# Patient Record
Sex: Female | Born: 1965 | Race: White | Hispanic: No | Marital: Married | Smoking: Never smoker
Health system: Southern US, Community
[De-identification: ages and names within clinical notes are randomized; demographics above are authoritative.]

## PROBLEM LIST (undated history)

## (undated) DIAGNOSIS — G43909 Migraine, unspecified, not intractable, without status migrainosus: Secondary | ICD-10-CM

## (undated) DIAGNOSIS — F419 Anxiety disorder, unspecified: Secondary | ICD-10-CM

## (undated) HISTORY — PX: ABDOMINAL HYSTERECTOMY: SHX81

---

## 1998-10-15 ENCOUNTER — Ambulatory Visit (HOSPITAL_COMMUNITY): Admission: RE | Admit: 1998-10-15 | Discharge: 1998-10-15 | Payer: Self-pay | Admitting: Internal Medicine

## 1998-10-15 ENCOUNTER — Encounter: Payer: Self-pay | Admitting: Internal Medicine

## 1999-06-21 ENCOUNTER — Ambulatory Visit (HOSPITAL_COMMUNITY): Admission: RE | Admit: 1999-06-21 | Discharge: 1999-06-21 | Payer: Self-pay | Admitting: *Deleted

## 2000-07-28 ENCOUNTER — Encounter: Payer: Self-pay | Admitting: Family Medicine

## 2000-07-28 ENCOUNTER — Ambulatory Visit (HOSPITAL_COMMUNITY): Admission: RE | Admit: 2000-07-28 | Discharge: 2000-07-28 | Payer: Self-pay | Admitting: Family Medicine

## 2000-07-31 ENCOUNTER — Encounter: Payer: Self-pay | Admitting: Urology

## 2000-07-31 ENCOUNTER — Emergency Department (HOSPITAL_COMMUNITY): Admission: EM | Admit: 2000-07-31 | Discharge: 2000-07-31 | Payer: Self-pay | Admitting: Emergency Medicine

## 2000-08-02 ENCOUNTER — Other Ambulatory Visit: Admission: RE | Admit: 2000-08-02 | Discharge: 2000-08-02 | Payer: Self-pay | Admitting: Family Medicine

## 2000-08-04 ENCOUNTER — Inpatient Hospital Stay (HOSPITAL_COMMUNITY): Admission: AD | Admit: 2000-08-04 | Discharge: 2000-08-07 | Payer: Self-pay | Admitting: Obstetrics and Gynecology

## 2000-08-05 ENCOUNTER — Encounter: Payer: Self-pay | Admitting: Obstetrics and Gynecology

## 2001-02-06 ENCOUNTER — Other Ambulatory Visit: Admission: RE | Admit: 2001-02-06 | Discharge: 2001-02-06 | Payer: Self-pay | Admitting: Obstetrics and Gynecology

## 2001-02-06 ENCOUNTER — Encounter (INDEPENDENT_AMBULATORY_CARE_PROVIDER_SITE_OTHER): Payer: Self-pay | Admitting: Specialist

## 2001-02-07 ENCOUNTER — Encounter (INDEPENDENT_AMBULATORY_CARE_PROVIDER_SITE_OTHER): Payer: Self-pay

## 2001-02-07 ENCOUNTER — Observation Stay (HOSPITAL_COMMUNITY): Admission: RE | Admit: 2001-02-07 | Discharge: 2001-02-08 | Payer: Self-pay | Admitting: Obstetrics and Gynecology

## 2004-01-06 ENCOUNTER — Other Ambulatory Visit: Admission: RE | Admit: 2004-01-06 | Discharge: 2004-01-06 | Payer: Self-pay | Admitting: Family Medicine

## 2005-12-28 ENCOUNTER — Ambulatory Visit (HOSPITAL_COMMUNITY): Admission: RE | Admit: 2005-12-28 | Discharge: 2005-12-28 | Payer: Self-pay | Admitting: Family Medicine

## 2007-01-30 ENCOUNTER — Ambulatory Visit (HOSPITAL_COMMUNITY): Admission: RE | Admit: 2007-01-30 | Discharge: 2007-01-30 | Payer: Self-pay | Admitting: Family Medicine

## 2007-03-06 ENCOUNTER — Other Ambulatory Visit: Admission: RE | Admit: 2007-03-06 | Discharge: 2007-03-06 | Payer: Self-pay | Admitting: Family Medicine

## 2008-04-22 ENCOUNTER — Ambulatory Visit (HOSPITAL_COMMUNITY): Admission: RE | Admit: 2008-04-22 | Discharge: 2008-04-22 | Payer: Self-pay | Admitting: Family Medicine

## 2009-09-22 ENCOUNTER — Ambulatory Visit (HOSPITAL_COMMUNITY): Admission: RE | Admit: 2009-09-22 | Discharge: 2009-09-22 | Payer: Self-pay | Admitting: Family Medicine

## 2011-04-22 NOTE — H&P (Signed)
[facility]  Patient:    Michelle Cameron, Michelle Cameron                     MRN: [id number] Adm. Date:  [id number] Disc. Date: [id number] Attending:  Shelba Flake CC:         Dario Guardian, M.D.  Lindaann Slough, M.D.   History and Physical  HISTORY OF PRESENT ILLNESS:   The patient is a 45 year old female para 2-0-1-2 who is admitted for IV hydration and pain medication because of kidney stones. The patient had a CT scan performed on July 28, 2000 that showed a 6 mm right kidney stone.  The patient has a history of kidney stones.  The appendix appeared normal.  There was no evidence of an obstructive process in the right ureter.  Review of the pelvis showed what appeared to be normal pelvic organs. The patient has not been hungry, but denies diarrhea and bloody stools.  She denies fever and chills.  She had been seen by a urologist and by her family physician and they were uncertain of the etiology of her discomfort.  Her last menstrual period was July 17, 2000.  Her husband had a vasectomy for contraception.  PAST MEDICAL HISTORY:         The patient fractured her right wrist as a child.  The patient had a cesarean section in 1995 and again in 1998.  She had a D&C in 1994.  She had her wisdom teeth removed in 1996.  ALLERGIES:                    PENICILLIN.  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 She denies cigarette use, alcohol use, and recreational drug use.  REVIEW OF SYSTEMS:            See history of present illness.  FAMILY HISTORY:               The patients mother has thyroid problems.  PHYSICAL EXAMINATION:  VITAL SIGNS:                  Vital signs are stable.  HEENT:                        Within normal limits except for patient in mild distress.  CHEST:                        Clear.  HEART:                        Regular rate and rhythm.  BREASTS:                      No  masses.  ABDOMEN:                      Soft in the upper quadrant and there is slight guarding in the lower quadrant.  There is no rebound tenderness.  Bowel sounds are normal.  No masses are appreciated.  BACK:                         No CVA tenderness.  EXTREMITIES:  Within normal limits.  NEUROLOGIC:                   Normal.  PELVIC:                       External genitalia is normal.  Vagina is normal. Cervix is nontender to motion.  Uterus is upper limits of normal size and anterior.  Adnexa:  No masses are appreciated.  There is mild tenderness in the right adnexa.  ASSESSMENT:                   1. Right lower abdominal pain.                               2. A 6 mm kidney stone.  PLAN:                         A long discussion was held with the patient and her husband about possible etiologies of her discomfort.  We plan to bring the patient to the hospital for IV hydration and pain medication.  We will reevaluate on August 05, 2000 and then plan future therapies based on how she is feeling at that time. DD:  08/04/00 TD:  08/04/00 Job: [id number]

## 2011-04-22 NOTE — Op Note (Signed)
[facility]  Patient:    PRINCELLA, JASKIEWICZ                     MRN: [id number] Proc. Date: 02/07/01 Adm. Date:  [id number] Attending:  Leonard Schwartz                           Operative Report  PREOPERATIVE DIAGNOSIS:       Pelvic pain.  Pelvic adhesions.  POSTOPERATIVE DIAGNOSIS:      Pelvic pain.  Endometriosis.  OPERATION:                    Diagnostic laparoscopy with vaginal hysterectomy.  SURGEON:                      Janine Limbo, M.D.  ASSISTANT:                    Henreitta Leber, P.A.  ANESTHESIA:                   General anesthesia.  ESTIMATED BLOOD LOSS:  INDICATIONS:                  Ms. Bains is a 45 year old female, para 2-0-1-2, who presents with pelvic pain.  Pain medications and hormonal therapy have not relieved her discomfort.  The patient had a diagnostic laparoscopy in 2001 which showed a thick band of adhesions between the anterior uterus and the bladder flap.  The patient has had two prior cesarean deliveries.  The adhesions were lysed at the time of laparoscopy.  The patients discomfort did not improve, however.  She now wants to proceed with definitive therapy.  She understands the indications for her procedure and she accepts the risks of, but not limited to, anesthetic complications, bleeding, infections, and possible damage to the surrounding organs.  FINDINGS:                     The uterus was upper limits of normal size. There was endometriosis present on the anterior surface of the uterus.  The fallopian tubes and ovaries appeared normal.  There were no adhesions noted at this time.  The bowel and the upper abdomen appeared normal.  DESCRIPTION OF PROCEDURE:     The patient was taken to the operating room where a general anesthesia was given.  The patients abdomen, perineum, and vagina were prepped with multiple layers of Betadine.  A three-way Foley catheter was placed in the bladder.   Examination under anesthesia was performed.  A Hulka tenaculum was placed inside the uterus.  The patient was then sterilely draped.  The subumbilical area was injected with 5 cc of 0.5% Marcaine.  A subumbilical incision was made and the Veress needle was inserted without difficulty.  Proper placement was confirmed using the saline drop test. A pneumoperitoneum was obtained.  The laparoscopic trocar and then the laparoscope were substituted for the Veress needle.  The pelvic structures were visualized with findings as mentioned above.  We were then comfortable that we could safely proceed with a vaginal hysterectomy.  The cervix was then injected with a diluted solution of Pitressin and saline.  A circumferential incision was made around the cervix and then the anterior and posterior cul-de-sacs were sharply entered.  Alternating from left to right, uterosacral ligaments, paracervical tissues, parametrial tissues, uterine arteries, and upper pedicles  were clamped, cut, sutured, and tied securely.  The uterus was inverted through the posterior colpotomy.  The remainder of the upper pedicles were then secured and the uterus was transected from our operative field.  The upper pedicles were ligated using first a free tie and then a tied suture ligature.  Hemostasis was achieved using figure-of-eight sutures in the cuff. At this point hemostasis was noted to be adequate.  Sutures attached to the uterosacral ligaments were brought out through the vaginal angles and tied securely.  A McCall culdoplasty was placed in the posterior cul-de-sac incorporating the uterosacral ligaments bilaterally and the posterior peritoneal surface.  After a final check was made for hemostasis again, then the vaginal cuff was closed using figure-of-eight sutures.  We incorporated the anterior vaginal mucosa, the anterior peritoneum, the posterior peritoneum, and then the posterior vaginal mucosa.  Sponge, needle,  and instrument counts were correct.  0 Vicryl was the suture material used throughout the case.  The McCall culdoplasty suture was then tied and the apex of the vagina was noted to elevate into the midpelvis.  We then reestablished a pneumoperitoneum.  The pelvis was carefully inspected and there was no evidence of damage to any of the vital pelvic structures.  Hemostasis was adequate.  The pneumoperitoneum was then allowed to escape.  All instruments were removed.  The subumbilical incision was closed using a deep and superficial suture of 3-0 Vicryl.  Sponge, needle, and instrument counts were correct x 2 occasions.  The estimated blood loss was 200 cc.  The patient was noted to drain clear yellow urine.  The patient tolerated her procedure well. She was given 30 mg of IV Toradol during her procedure.  She was awakened from her anesthetic and taken to the recovery room in stable condition. DD:  02/07/01 TD:  02/08/01 Job: [id number]

## 2011-04-22 NOTE — Discharge Summary (Signed)
[facility]  Patient:    Michelle Cameron, Michelle Cameron                     MRN: [id number] Adm. Date:  [id number] Disc. Date: [id number] Attending:  Leonard Schwartz Dictator:   Marquis Lunch. Lowell Guitar, PA-C                           Discharge Summary  DISCHARGE DIAGNOSES:          Pelvic pain, pelvic adhesions, endometriosis.  PROCEDURE:                    On the date of admission patient underwent a diagnostic laparoscopy and a vaginal hysterectomy.  HISTORY OF PRESENT ILLNESS:   Ms. Hoffert is a 45 year old female para 2-0-1-2 with a history of pelvic pain which has been unresponsive to hormonal therapies and prescription analgesia.  Patient presents for vaginal hysterectomy with laparoscopic assistance due to her history of pelvic adhesions.  Please see dictated history and physical examination for details.  PHYSICAL EXAMINATION  VITAL SIGNS:                  Weight 217 pounds, height 5 feet 6.25 inches.  GENERAL:                      Within normal limits.  PELVIC:                       EG, BUS is within normal limits.  Vagina is normal.  Cervix is nontender.  Uterus is upper limits of normal size, shape, and consistency.  Adnexa without masses.  Rectovaginal confirms.  HOSPITAL COURSE:              On the date of admission patient underwent a vaginal hysterectomy with assistance through the laparoscope, tolerating both procedures well.  Postoperative course was unremarkable with patient quickly tolerating a regular diet and resuming bladder function by postoperative day #1 and was deemed ready for discharge home.  Postoperative hemoglobin 10.4, preoperative hemoglobin 12.9.  DISCHARGE MEDICATIONS:        1. Vicodin one to two tablets q.4-6h. as needed                                  for pain.                               2. Phenergan 25 mg one tablet q.i.d. as needed                                  for nausea.                               3.  Ibuprofen 600 mg one tablet q.6h. with food                                  for five days, then as needed for pain.  4. Stool softeners 60 mg one tablet b.i.d. until                                  bowel movements are regular.                               5. Iron 325 mg one tablet b.i.d. for six weeks.  FOLLOW-UP:                    Patient has a six week postoperative appointment with Dr. Stefano Gaul on March 26, 2001 at 2:30 p.m.  DISCHARGE INSTRUCTIONS:       Patient was given a copy of [facility] postoperative instruction sheet.  She was further advised to avoid driving for two weeks, heavy lifting for four weeks, and intercourse for six weeks.  PATHOLOGY:                    Uterus and cervix:  Cervix with no pathologic abnormalities, benign secretory endometrium, 4 mm atypical uterine leiomyoma, uterine serosal fibrous adhesions associated with extension foreign body reaction. DD:  02/28/01 TD:  02/28/01 Job: [id number]

## 2011-04-22 NOTE — Op Note (Signed)
[facility]  Patient:    Michelle Cameron, Michelle Cameron                     MRN: [id number] Proc. Date: 08/06/00 Adm. Date:  [id number] Disc. Date: [id number] Attending:  Leonard Schwartz CC:         Lindaann Slough, M.D.  Dario Guardian, M.D.   Operative Report  PREOPERATIVE DIAGNOSES:       1. Pelvic pain.                               2. Right kidney stone.                               3. Right ovarian cyst.  POSTOPERATIVE DIAGNOSES:      1. Pelvic pain.                               2. Right kidney stone.                               3. right ovarian cyst.                               4. Pelvic adhesions.  OPERATION/PROCEDURE:          1. Diagnostic laparoscopy.                               2. Laparoscopic lysis of adhesions.                               3. Laparoscopic aspiration of right ovarian                                  cyst.  SURGEON:                      Janine Limbo, M.D.  ANESTHESIA:                   General.  INDICATIONS FOR PROCEDURE:    Ms. Keener is a 45 year old who presents complaining of three weeks of abdominal pain.  Her pain got acutely worse one week ago.  A CT scan and an ultrasound of the pelvis showed a right kidney stone.  A right ovarian cyst was also noted.  The patient was hospitalized on August 04, 2000 for vigorous hydration and for IV pain medications.  Her discomfort did not resolve.  The patient presents at this point for diagnostic laparoscopy.  The patient has had three cesarean sections in the past.  The patient understands the indications for her surgical procedure, and she accepts the risks of, but not limited to, anesthetic complications, bleeding, infection, and possible damage to the surrounding organs.  I consulted a urologist today and it was felt that urology input was not necessary.  FINDINGS:                     The right fallopian tube, left  fallopian tube, and left ovary were  normal.  The right ovary was normal and it continued a functional cyst.  There were adhesions between the omentum and the anterior abdominal wall in the pelvis.  There was also a thick band of adhesions between the anterior uterus and the anterior abdominal wall.  DESCRIPTION OF PROCEDURE:     The patient was taken to the operating room and a general anesthetic given.  The abdomen, perineum, and vagina were prepped with multiple layers of Betadine.  A Hulka tenaculum was placed inside the uterus and examined under anesthesia was performed.  A Foley catheter was placed in the bladder.  The patient was sterilely draped.  The subumbilical area was injected with Marcaine 0.25% and an incision made.  The Veress needle was inserted and proper placement confirmed using the saline drop test.  A pneumoperitoneum was then obtained.  The laparoscopic trocar and laparoscope were substituted for the Veress needle and the pelvis visualized, with findings as mentioned above.  The suprapubic area on the left was then injected with Marcaine 0.25% and a 5 mm trocar inserted into the abdominal cavity under direct visualization.  Pictures were taken of the patients pelvic anatomy.  The adhesions were then lysed using a combination of sharp dissection, blunt dissection, and hydrodissection.  Care was taken not to damage any of the vital underlying structures.  Hemostasis was achieved of the anterior uterus using bipolar cautery.  The cyst in the right ovary was then aspirated.  The pelvis was vigorously irrigated.  Hemostasis was adequate. All instruments were then removed after the pneumoperitoneum was allowed to escape.  The incisions were closed using deep and superficial sutures of 4-0 Vicryl.  Sponge, needle, and instrument counts were correct on two occasions. Estimated blood loss was 50 cc.  The patient tolerated the procedure well and was awakened from her anesthetic and taken to the recovery room in  stable condition.  She was noted to drain clear, yellow urine at the end of her procedure.  FOLLOW-UP INSTRUCTIONS:       The patient will be observed in the recovery room and then sent back to the Floor this afternoon.  We are hoping to discharge the patient home this afternoon. DD:  08/06/00 TD:  08/08/00 Job: [id number]

## 2011-04-22 NOTE — H&P (Signed)
[facility]  Patient:    Michelle Cameron, Michelle Cameron                       MRN: [id number] Attending:  Janine Limbo, M.D. CC:         Dario Guardian, M.D.   History and Physical  HISTORY OF PRESENT ILLNESS:   Michelle Cameron is a 45 year old female, para 2-0-1-2, who is admitted for vaginal hysterectomy with the assistance of the laparoscope because of continued pelvic pain. The patient has tried pain medications without success. The patient had a diagnostic laparoscopy performed on August 04, 2000, where she was found to have a broad band of adhesions in the pelvis, as well as a broad band of adhesions between the anterior uterus and the bladder. The patient has continued to have pain since that time. She has a known history of kidney stones; and even though her kidney stones have been appropriately treated and removed, her discomfort continues. She is ready to proceed at this time with definitive therapy. Her husband has had a vasectomy. The patient had a cesarean section in June of 1998. She also had a cesarean section in 1995 because of a face presentation. She had a D&C in 1994 because of a miscarriage.  DRUG ALLERGIES:               PENICILLIN causes a rash.  PAST MEDICAL HISTORY:         Please see history of present illness. The patient has a history of galacturia. This has resolved spontaneously. The patient fractured her wrist on two occasions as a child. She had her wisdom teeth removed in 1996.  SOCIAL HISTORY:               The patient drinks alcohol socially. She denies cigarette use and recreational drug use.  REVIEW OF SYSTEMS:            Noncontributory.  FAMILY HISTORY:               The patients uncle had a stroke.  PHYSICAL EXAMINATION:  VITAL SIGNS:                  Weight is 217 pounds, height is 5 feet 6 1/4 inches.  HEENT:                        Within normal limits.  CHEST:                        Clear.  HEART:                         Regular rate and rhythm.  BREASTS:                      Without masses.  ABDOMEN:                      Nontender.  EXTREMITIES:                  Within normal limits.  NEUROLOGICAL:                 Within normal limits.  PELVIC:                       External genitalia is normal. The vagina  is normal. Cervix is nontender. The uterus is upper limit normal size, shape, and consistency. Adnexa no masses. Rectovaginal exam confirms.  ASSESSMENT:                   1. Pelvic pain.                               2. Pelvic adhesions.  PLAN:                         The patient will undergo a laparoscopy-assisted vaginal hysterectomy. She understands the indications for her surgical procedure, and she accepts the risks of, but not limited to, anesthetic complications, bleeding, infections, and possible damage to the surrounding organs. She understands that no guarantees can be given concerning the total relief of her discomfort. DD:  02/06/01 TD:  02/06/01 Job: [id number]

## 2011-04-22 NOTE — Discharge Summary (Signed)
[facility]  Patient:    Michelle Cameron, Michelle Cameron                     MRN: [id number] Adm. Date:  [id number] Disc. Date: [id number] Attending:  Leonard Schwartz                           Discharge Summary  ADMITTING DIAGNOSES:          1. Abdominal/pelvic pain.                               2. Right ureteral calculus.  DISCHARGE DIAGNOSES:          1. Abdominal/pelvic pain, improved.                               2. Right ureteral calculus.                               3. Pelvic adhesions, status post laparoscopy                                  with lysis of adhesions.  DISCHARGE INSTRUCTIONS:       Printed instructions from [facility] for laparoscopy were given.  DISCHARGE MEDICATIONS:        1. Percocet 1-2 p.o. q.4h p.r.n. pain.  FOLLOW-UP:                    The patient is to call for a two week follow-up appointment.  HOSPITAL COURSE:              The patient was admitted to the hospital initially for evaluation and IV hydration.  Her pain seemed to improve slightly over the night from August 04, 2000 to August 05, 2000.  By August 06, 2000, however, the patient had had exacerbation of her pain and was given the option of proceeding with diagnostic laparoscopy for further evaluation of a gynecologic etiology of her pain.  She underwent diagnostic laparoscopy on August 06, 2000 with findings of a right ovarian cyst and pelvic adhesions.  She underwent aspiration of the right ovarian cyst and laparoscopic lysis of adhesions.  By the following morning, August 07, 2000, she was feeling much, much better, and was able to tolerate a regular diet. She was afebrile, with good GU function, and was deemed to have received maximum hospital benefit and to be ready for discharge home, with the above noted instructions. DD:  08/07/00 TD:  08/08/00 Job: [id number]

## 2011-06-27 ENCOUNTER — Other Ambulatory Visit (HOSPITAL_COMMUNITY)
Admission: RE | Admit: 2011-06-27 | Discharge: 2011-06-27 | Disposition: A | Discharge status: Home / Self Care | Payer: 59 | Source: Ambulatory Visit | Attending: Family Medicine | Admitting: Family Medicine

## 2011-06-27 DIAGNOSIS — Z0000 Encounter for general adult medical examination without abnormal findings: Secondary | ICD-10-CM | POA: Insufficient documentation

## 2013-06-24 ENCOUNTER — Other Ambulatory Visit (HOSPITAL_COMMUNITY): Payer: Self-pay | Admitting: Family Medicine

## 2013-06-24 DIAGNOSIS — Z1231 Encounter for screening mammogram for malignant neoplasm of breast: Secondary | ICD-10-CM

## 2013-07-04 ENCOUNTER — Ambulatory Visit (HOSPITAL_COMMUNITY)
Admission: RE | Admit: 2013-07-04 | Discharge: 2013-07-04 | Disposition: A | Discharge status: Home / Self Care | Payer: 59 | Source: Ambulatory Visit | Attending: Family Medicine | Admitting: Family Medicine

## 2013-07-04 DIAGNOSIS — Z1231 Encounter for screening mammogram for malignant neoplasm of breast: Secondary | ICD-10-CM | POA: Insufficient documentation

## 2015-03-24 ENCOUNTER — Other Ambulatory Visit (HOSPITAL_COMMUNITY): Payer: Self-pay | Admitting: Family Medicine

## 2015-03-24 DIAGNOSIS — Z1231 Encounter for screening mammogram for malignant neoplasm of breast: Secondary | ICD-10-CM

## 2015-04-01 ENCOUNTER — Ambulatory Visit (HOSPITAL_COMMUNITY)
Admission: RE | Admit: 2015-04-01 | Discharge: 2015-04-01 | Disposition: A | Discharge status: Home / Self Care | Payer: 59 | Source: Ambulatory Visit | Attending: Family Medicine | Admitting: Family Medicine

## 2015-04-01 DIAGNOSIS — Z1231 Encounter for screening mammogram for malignant neoplasm of breast: Secondary | ICD-10-CM | POA: Diagnosis not present

## 2015-06-10 ENCOUNTER — Other Ambulatory Visit: Payer: Self-pay | Admitting: Gastroenterology

## 2015-06-10 DIAGNOSIS — R933 Abnormal findings on diagnostic imaging of other parts of digestive tract: Secondary | ICD-10-CM

## 2015-06-12 ENCOUNTER — Other Ambulatory Visit: Payer: 59

## 2015-06-12 ENCOUNTER — Ambulatory Visit
Admission: RE | Admit: 2015-06-12 | Discharge: 2015-06-12 | Disposition: A | Discharge status: Home / Self Care | Payer: 59 | Source: Ambulatory Visit | Attending: Gastroenterology | Admitting: Gastroenterology

## 2015-06-12 DIAGNOSIS — R933 Abnormal findings on diagnostic imaging of other parts of digestive tract: Secondary | ICD-10-CM

## 2015-06-12 MED ORDER — IOPAMIDOL (ISOVUE-300) INJECTION 61%
125.0000 mL | Freq: Once | INTRAVENOUS | Status: AC | PRN
  Administered 2015-06-12: 125 mL via INTRAVENOUS

## 2015-07-20 ENCOUNTER — Ambulatory Visit: Payer: 59 | Admitting: Dietician

## 2015-11-12 ENCOUNTER — Encounter (HOSPITAL_BASED_OUTPATIENT_CLINIC_OR_DEPARTMENT_OTHER): Payer: Self-pay

## 2015-11-12 ENCOUNTER — Emergency Department (HOSPITAL_BASED_OUTPATIENT_CLINIC_OR_DEPARTMENT_OTHER)
Admission: EM | Admit: 2015-11-12 | Discharge: 2015-11-12 | Disposition: A | Discharge status: Home / Self Care | Payer: 59 | Attending: Emergency Medicine | Admitting: Emergency Medicine

## 2015-11-12 DIAGNOSIS — Z79899 Other long term (current) drug therapy: Secondary | ICD-10-CM | POA: Insufficient documentation

## 2015-11-12 DIAGNOSIS — G43909 Migraine, unspecified, not intractable, without status migrainosus: Secondary | ICD-10-CM

## 2015-11-12 DIAGNOSIS — G43009 Migraine without aura, not intractable, without status migrainosus: Secondary | ICD-10-CM | POA: Insufficient documentation

## 2015-11-12 DIAGNOSIS — Z880 Allergy status to penicillin: Secondary | ICD-10-CM | POA: Insufficient documentation

## 2015-11-12 DIAGNOSIS — H53149 Visual discomfort, unspecified: Secondary | ICD-10-CM | POA: Diagnosis present

## 2015-11-12 HISTORY — DX: Migraine, unspecified, not intractable, without status migrainosus: G43.909

## 2015-11-12 MED ORDER — KETOROLAC TROMETHAMINE 30 MG/ML IJ SOLN
30.0000 mg | Freq: Once | INTRAMUSCULAR | Status: AC
  Administered 2015-11-12: 30 mg via INTRAVENOUS
  Filled 2015-11-12: qty 1

## 2015-11-12 MED ORDER — DIPHENHYDRAMINE HCL 50 MG/ML IJ SOLN
12.5000 mg | Freq: Once | INTRAMUSCULAR | Status: AC
  Administered 2015-11-12: 12.5 mg via INTRAVENOUS
  Filled 2015-11-12: qty 1

## 2015-11-12 MED ORDER — METOCLOPRAMIDE HCL 5 MG/ML IJ SOLN
10.0000 mg | Freq: Once | INTRAMUSCULAR | Status: AC
  Administered 2015-11-12: 10 mg via INTRAVENOUS
  Filled 2015-11-12: qty 2

## 2015-11-12 MED ORDER — DEXAMETHASONE SODIUM PHOSPHATE 10 MG/ML IJ SOLN
10.0000 mg | Freq: Once | INTRAMUSCULAR | Status: AC
  Administered 2015-11-12: 10 mg via INTRAVENOUS
  Filled 2015-11-12: qty 1

## 2015-11-12 NOTE — Discharge Instructions (Signed)
Migraine Headache A migraine headache is an intense, throbbing pain on one or both sides of your head. A migraine can last for 30 minutes to several hours. CAUSES  The exact cause of a migraine headache is not always known. However, a migraine may be caused when nerves in the brain become irritated and release chemicals that cause inflammation. This causes pain. Certain things may also trigger migraines, such as:  Alcohol.  Smoking.  Stress.  Menstruation.  Aged cheeses.  Foods or drinks that contain nitrates, glutamate, aspartame, or tyramine.  Lack of sleep.  Chocolate.  Caffeine.  Hunger.  Physical exertion.  Fatigue.  Medicines used to treat chest pain (nitroglycerine), birth control pills, estrogen, and some blood pressure medicines. SIGNS AND SYMPTOMS  Pain on one or both sides of your head.  Pulsating or throbbing pain.  Severe pain that prevents daily activities.  Pain that is aggravated by any physical activity.  Nausea, vomiting, or both.  Dizziness.  Pain with exposure to bright lights, loud noises, or activity.  General sensitivity to bright lights, loud noises, or smells. Before you get a migraine, you may get warning signs that a migraine is coming (aura). An aura may include:  Seeing flashing lights.  Seeing bright spots, halos, or zigzag lines.  Having tunnel vision or blurred vision.  Having feelings of numbness or tingling.  Having trouble talking.  Having muscle weakness. DIAGNOSIS  A migraine headache is often diagnosed based on:  Symptoms.  Physical exam.  A CT scan or MRI of your head. These imaging tests cannot diagnose migraines, but they can help rule out other causes of headaches. TREATMENT Medicines may be given for pain and nausea. Medicines can also be given to help prevent recurrent migraines.  HOME CARE INSTRUCTIONS  Only take over-the-counter or prescription medicines for pain or discomfort as directed by your  health care provider. The use of long-term narcotics is not recommended.  Lie down in a dark, quiet room when you have a migraine.  Keep a journal to find out what may trigger your migraine headaches. For example, write down:  What you eat and drink.  How much sleep you get.  Any change to your diet or medicines.  Limit alcohol consumption.  Quit smoking if you smoke.  Get 7-9 hours of sleep, or as recommended by your health care provider.  Limit stress.  Keep lights dim if bright lights bother you and make your migraines worse. SEEK IMMEDIATE MEDICAL CARE IF:   Your migraine becomes severe.  You have a fever.  You have a stiff neck.  You have vision loss.  You have muscular weakness or loss of muscle control.  You start losing your balance or have trouble walking.  You feel faint or pass out.  You have severe symptoms that are different from your first symptoms. MAKE SURE YOU:   Understand these instructions.  Will watch your condition.  Will get help right away if you are not doing well or get worse.   This information is not intended to replace advice given to you by your health care provider. Make sure you discuss any questions you have with your health care provider.   Follow-up with her primary care provider for reevaluation. Return to the emergency department if you transversely over symptoms, vomiting, blurry vision, numbness or tingling in any of your extremities.

## 2015-11-12 NOTE — ED Notes (Signed)
Reports migraine that started Monday night.  Reports usually goes away with naproxen adn tylenol but hasnt went to [facility] last night got toradol with no relief.  Reports like her migraines but worse.  Pt reports nausea.  Denies blurred vision.

## 2015-11-14 NOTE — ED Provider Notes (Signed)
CSN: [id number]     Arrival date & time 11/12/15  1313 History   First MD Initiated Contact with Patient 11/12/15 1345     Chief Complaint  Patient presents with  . Migraine     (Consider location/radiation/quality/duration/timing/severity/associated sxs/prior Treatment) HPI   Michelle Cameron is a 49 y.o F who presents to the ED c/o migraine. Pt has long history of the same since she was a teenager. Pt states that she began experiencing a migraine on Monday night located in the R temporal region that has persisted. Unable to control with home naproxen. Pt states that this feels like her typical migraine, but is lasting longer than usual. Pt reports associated photophobia, nausea, no vomitingDenies visual changes, paresthesias, numbness, weakness.   Past Medical History  Diagnosis Date  . Migraine    Past Surgical History  Procedure Laterality Date  . Abdominal hysterectomy    . Cesarean section     No family history on file. Social History  Substance Use Topics  . Smoking status: Never Smoker   . Smokeless tobacco: None  . Alcohol Use: No   OB History    No data available     Review of Systems  All other systems reviewed and are negative.     Allergies  Penicillins  Home Medications   Prior to Admission medications   Medication Sig Start Date End Date Taking? Authorizing Provider  buPROPion (WELLBUTRIN XL) 150 MG 24 hr tablet Take 150 mg by mouth daily.   Yes Historical Provider, MD  busPIRone (BUSPAR) 30 MG tablet Take 30 mg by mouth 2 (two) times daily.   Yes Historical Provider, MD   BP 103/61 mmHg  Pulse 72  Temp(Src) 98.1 F (36.7 C) (Oral)  Resp 18  Ht  (1.676 m)  Wt 104.327 kg  BMI 37.14 kg/m2  SpO2 96% Physical Exam  Constitutional: She is oriented to person, place, and time. She appears well-developed and well-nourished. No distress.  HENT:  Head: Normocephalic and atraumatic.  Mouth/Throat: No oropharyngeal exudate.  Eyes: Conjunctivae and  EOM are normal. Pupils are equal, round, and reactive to light. Right eye exhibits no discharge. Left eye exhibits no discharge. No scleral icterus.  Neck: Neck supple.  Cardiovascular: Normal rate, regular rhythm, normal heart sounds and intact distal pulses.  Exam reveals no gallop and no friction rub.   No murmur heard. Pulmonary/Chest: Effort normal and breath sounds normal. No respiratory distress. She has no wheezes. She has no rales. She exhibits no tenderness.  Abdominal: Soft. She exhibits no distension. There is no tenderness. There is no guarding.  Musculoskeletal: Normal range of motion. She exhibits no edema.  Lymphadenopathy:    She has no cervical adenopathy.  Neurological: She is alert and oriented to person, place, and time. No cranial nerve deficit. She exhibits normal muscle tone. Coordination normal.  Strength 5/5 throughout. No sensory deficits.  No gait abnormality. Normal finger to nose, normal heel to shin.  Skin: Skin is warm and dry. No rash noted. She is not diaphoretic. No erythema. No pallor.  Psychiatric: She has a normal mood and affect. Her behavior is normal.  Nursing note and vitals reviewed.   ED Course  Procedures (including critical care time) Labs Review Labs Reviewed - No data to display  Imaging Review No results found. I have personally reviewed and evaluated these images and lab results as part of my medical decision-making.   EKG Interpretation None      MDM  Final diagnoses:  Migraine without status migrainosus, not intractable, unspecified migraine type   Pt HA treated and improved while in ED.  Presentation is like pts typical HA and non concerning for [facility], ICH, Meningitis, or temporal arteritis. Pt is afebrile with no focal neuro deficits, nuchal rigidity, or change in vision. Pt is to follow up with PCP to discuss prophylactic medication. Pt verbalizes understanding and is agreeable with plan to dc.      Lester KinsmanSamantha Tripp [place],  PA-C 11/14/15 1549  Loren Raceravid Yelverton, MD 11/19/15 [phone]

## 2015-11-18 ENCOUNTER — Other Ambulatory Visit: Payer: Self-pay | Admitting: Neurology

## 2015-11-18 MED ORDER — MONTELUKAST SODIUM 10 MG PO TABS: 10.0000 mg | ORAL_TABLET | Freq: Every day | ORAL | Status: DC

## 2015-12-15 ENCOUNTER — Other Ambulatory Visit: Payer: Self-pay

## 2015-12-15 ENCOUNTER — Other Ambulatory Visit: Payer: Self-pay | Admitting: *Deleted

## 2015-12-15 MED ORDER — DOXYCYCLINE HYCLATE 50 MG PO CAPS: 50.0000 mg | ORAL_CAPSULE | Freq: Two times a day (BID) | ORAL | Status: AC

## 2015-12-16 ENCOUNTER — Other Ambulatory Visit: Payer: Self-pay | Admitting: *Deleted

## 2015-12-16 MED ORDER — MONTELUKAST SODIUM 10 MG PO TABS: 10.0000 mg | ORAL_TABLET | Freq: Every day | ORAL | Status: AC

## 2016-12-07 DIAGNOSIS — N958 Other specified menopausal and perimenopausal disorders: Secondary | ICD-10-CM | POA: Diagnosis not present

## 2016-12-07 DIAGNOSIS — E782 Mixed hyperlipidemia: Secondary | ICD-10-CM | POA: Diagnosis not present

## 2016-12-07 DIAGNOSIS — E538 Deficiency of other specified B group vitamins: Secondary | ICD-10-CM | POA: Diagnosis not present

## 2016-12-14 ENCOUNTER — Other Ambulatory Visit: Payer: Self-pay | Admitting: Family Medicine

## 2016-12-14 DIAGNOSIS — E782 Mixed hyperlipidemia: Secondary | ICD-10-CM | POA: Diagnosis not present

## 2016-12-14 DIAGNOSIS — E538 Deficiency of other specified B group vitamins: Secondary | ICD-10-CM | POA: Diagnosis not present

## 2016-12-14 DIAGNOSIS — Z1231 Encounter for screening mammogram for malignant neoplasm of breast: Secondary | ICD-10-CM

## 2016-12-14 DIAGNOSIS — E559 Vitamin D deficiency, unspecified: Secondary | ICD-10-CM | POA: Diagnosis not present

## 2016-12-28 ENCOUNTER — Ambulatory Visit: Payer: 59

## 2016-12-28 DIAGNOSIS — E559 Vitamin D deficiency, unspecified: Secondary | ICD-10-CM | POA: Diagnosis not present

## 2016-12-28 DIAGNOSIS — E782 Mixed hyperlipidemia: Secondary | ICD-10-CM | POA: Diagnosis not present

## 2016-12-28 DIAGNOSIS — E538 Deficiency of other specified B group vitamins: Secondary | ICD-10-CM | POA: Diagnosis not present

## 2016-12-31 DIAGNOSIS — L2089 Other atopic dermatitis: Secondary | ICD-10-CM | POA: Diagnosis not present

## 2017-01-04 DIAGNOSIS — N958 Other specified menopausal and perimenopausal disorders: Secondary | ICD-10-CM | POA: Diagnosis not present

## 2017-01-04 DIAGNOSIS — E559 Vitamin D deficiency, unspecified: Secondary | ICD-10-CM | POA: Diagnosis not present

## 2017-01-04 DIAGNOSIS — E538 Deficiency of other specified B group vitamins: Secondary | ICD-10-CM | POA: Diagnosis not present

## 2017-01-04 DIAGNOSIS — E782 Mixed hyperlipidemia: Secondary | ICD-10-CM | POA: Diagnosis not present

## 2017-01-09 ENCOUNTER — Ambulatory Visit
Admission: RE | Admit: 2017-01-09 | Discharge: 2017-01-09 | Disposition: A | Discharge status: Home / Self Care | Payer: 59 | Source: Ambulatory Visit | Attending: Family Medicine | Admitting: Family Medicine

## 2017-01-09 DIAGNOSIS — Z1231 Encounter for screening mammogram for malignant neoplasm of breast: Secondary | ICD-10-CM

## 2017-01-11 DIAGNOSIS — N951 Menopausal and female climacteric states: Secondary | ICD-10-CM | POA: Diagnosis not present

## 2017-01-11 DIAGNOSIS — E538 Deficiency of other specified B group vitamins: Secondary | ICD-10-CM | POA: Diagnosis not present

## 2017-01-11 DIAGNOSIS — E782 Mixed hyperlipidemia: Secondary | ICD-10-CM | POA: Diagnosis not present

## 2017-01-14 DIAGNOSIS — R197 Diarrhea, unspecified: Secondary | ICD-10-CM | POA: Diagnosis not present

## 2017-01-14 DIAGNOSIS — R110 Nausea: Secondary | ICD-10-CM | POA: Diagnosis not present

## 2017-01-18 DIAGNOSIS — E538 Deficiency of other specified B group vitamins: Secondary | ICD-10-CM | POA: Diagnosis not present

## 2017-01-18 DIAGNOSIS — E782 Mixed hyperlipidemia: Secondary | ICD-10-CM | POA: Diagnosis not present

## 2017-01-18 DIAGNOSIS — E559 Vitamin D deficiency, unspecified: Secondary | ICD-10-CM | POA: Diagnosis not present

## 2017-01-25 DIAGNOSIS — E559 Vitamin D deficiency, unspecified: Secondary | ICD-10-CM | POA: Diagnosis not present

## 2017-01-25 DIAGNOSIS — E782 Mixed hyperlipidemia: Secondary | ICD-10-CM | POA: Diagnosis not present

## 2017-01-25 DIAGNOSIS — E538 Deficiency of other specified B group vitamins: Secondary | ICD-10-CM | POA: Diagnosis not present

## 2017-02-01 DIAGNOSIS — E782 Mixed hyperlipidemia: Secondary | ICD-10-CM | POA: Diagnosis not present

## 2017-02-01 DIAGNOSIS — E559 Vitamin D deficiency, unspecified: Secondary | ICD-10-CM | POA: Diagnosis not present

## 2017-02-01 DIAGNOSIS — E538 Deficiency of other specified B group vitamins: Secondary | ICD-10-CM | POA: Diagnosis not present

## 2017-02-08 DIAGNOSIS — E782 Mixed hyperlipidemia: Secondary | ICD-10-CM | POA: Diagnosis not present

## 2017-02-08 DIAGNOSIS — E559 Vitamin D deficiency, unspecified: Secondary | ICD-10-CM | POA: Diagnosis not present

## 2017-02-08 DIAGNOSIS — E538 Deficiency of other specified B group vitamins: Secondary | ICD-10-CM | POA: Diagnosis not present

## 2017-02-15 DIAGNOSIS — E538 Deficiency of other specified B group vitamins: Secondary | ICD-10-CM | POA: Diagnosis not present

## 2017-02-15 DIAGNOSIS — E782 Mixed hyperlipidemia: Secondary | ICD-10-CM | POA: Diagnosis not present

## 2017-02-15 DIAGNOSIS — E559 Vitamin D deficiency, unspecified: Secondary | ICD-10-CM | POA: Diagnosis not present

## 2017-02-22 DIAGNOSIS — E782 Mixed hyperlipidemia: Secondary | ICD-10-CM | POA: Diagnosis not present

## 2017-02-22 DIAGNOSIS — E559 Vitamin D deficiency, unspecified: Secondary | ICD-10-CM | POA: Diagnosis not present

## 2017-02-22 DIAGNOSIS — E538 Deficiency of other specified B group vitamins: Secondary | ICD-10-CM | POA: Diagnosis not present

## 2017-02-27 DIAGNOSIS — L309 Dermatitis, unspecified: Secondary | ICD-10-CM | POA: Diagnosis not present

## 2017-03-01 DIAGNOSIS — E782 Mixed hyperlipidemia: Secondary | ICD-10-CM | POA: Diagnosis not present

## 2017-03-01 DIAGNOSIS — E538 Deficiency of other specified B group vitamins: Secondary | ICD-10-CM | POA: Diagnosis not present

## 2017-03-01 DIAGNOSIS — E559 Vitamin D deficiency, unspecified: Secondary | ICD-10-CM | POA: Diagnosis not present

## 2017-03-15 DIAGNOSIS — E782 Mixed hyperlipidemia: Secondary | ICD-10-CM | POA: Diagnosis not present

## 2017-03-15 DIAGNOSIS — E559 Vitamin D deficiency, unspecified: Secondary | ICD-10-CM | POA: Diagnosis not present

## 2017-03-15 DIAGNOSIS — E538 Deficiency of other specified B group vitamins: Secondary | ICD-10-CM | POA: Diagnosis not present

## 2017-03-28 DIAGNOSIS — E538 Deficiency of other specified B group vitamins: Secondary | ICD-10-CM | POA: Diagnosis not present

## 2017-03-28 DIAGNOSIS — E559 Vitamin D deficiency, unspecified: Secondary | ICD-10-CM | POA: Diagnosis not present

## 2017-03-28 DIAGNOSIS — E782 Mixed hyperlipidemia: Secondary | ICD-10-CM | POA: Diagnosis not present

## 2017-04-12 DIAGNOSIS — E782 Mixed hyperlipidemia: Secondary | ICD-10-CM | POA: Diagnosis not present

## 2017-04-12 DIAGNOSIS — E559 Vitamin D deficiency, unspecified: Secondary | ICD-10-CM | POA: Diagnosis not present

## 2017-04-12 DIAGNOSIS — N951 Menopausal and female climacteric states: Secondary | ICD-10-CM | POA: Diagnosis not present

## 2017-04-19 DIAGNOSIS — N958 Other specified menopausal and perimenopausal disorders: Secondary | ICD-10-CM | POA: Diagnosis not present

## 2017-04-19 DIAGNOSIS — E782 Mixed hyperlipidemia: Secondary | ICD-10-CM | POA: Diagnosis not present

## 2017-09-04 ENCOUNTER — Other Ambulatory Visit: Payer: Self-pay | Admitting: Family Medicine

## 2017-09-04 DIAGNOSIS — R109 Unspecified abdominal pain: Secondary | ICD-10-CM | POA: Diagnosis not present

## 2017-09-04 DIAGNOSIS — Z23 Encounter for immunization: Secondary | ICD-10-CM | POA: Diagnosis not present

## 2017-09-04 DIAGNOSIS — Z87442 Personal history of urinary calculi: Secondary | ICD-10-CM

## 2017-09-04 DIAGNOSIS — R1084 Generalized abdominal pain: Secondary | ICD-10-CM

## 2017-09-05 ENCOUNTER — Ambulatory Visit
Admission: RE | Admit: 2017-09-05 | Discharge: 2017-09-05 | Disposition: A | Discharge status: Home / Self Care | Payer: 59 | Source: Ambulatory Visit | Attending: Family Medicine | Admitting: Family Medicine

## 2017-09-05 DIAGNOSIS — Z87442 Personal history of urinary calculi: Secondary | ICD-10-CM

## 2017-09-05 DIAGNOSIS — R109 Unspecified abdominal pain: Secondary | ICD-10-CM | POA: Diagnosis not present

## 2017-09-05 DIAGNOSIS — R1084 Generalized abdominal pain: Secondary | ICD-10-CM

## 2017-09-12 ENCOUNTER — Other Ambulatory Visit: Payer: Self-pay | Admitting: Family Medicine

## 2017-09-12 DIAGNOSIS — R109 Unspecified abdominal pain: Secondary | ICD-10-CM

## 2017-09-13 ENCOUNTER — Ambulatory Visit
Admission: RE | Admit: 2017-09-13 | Discharge: 2017-09-13 | Disposition: A | Discharge status: Home / Self Care | Payer: 59 | Source: Ambulatory Visit | Attending: Family Medicine | Admitting: Family Medicine

## 2017-09-13 ENCOUNTER — Other Ambulatory Visit (HOSPITAL_COMMUNITY): Payer: Self-pay | Admitting: Physician Assistant

## 2017-09-13 DIAGNOSIS — R1031 Right lower quadrant pain: Secondary | ICD-10-CM | POA: Diagnosis not present

## 2017-09-13 DIAGNOSIS — R1011 Right upper quadrant pain: Secondary | ICD-10-CM | POA: Diagnosis not present

## 2017-09-13 DIAGNOSIS — R1013 Epigastric pain: Secondary | ICD-10-CM | POA: Diagnosis not present

## 2017-09-13 DIAGNOSIS — R109 Unspecified abdominal pain: Secondary | ICD-10-CM

## 2017-09-15 DIAGNOSIS — R1011 Right upper quadrant pain: Secondary | ICD-10-CM | POA: Diagnosis not present

## 2017-09-25 DIAGNOSIS — R933 Abnormal findings on diagnostic imaging of other parts of digestive tract: Secondary | ICD-10-CM | POA: Diagnosis not present

## 2017-09-25 DIAGNOSIS — D72829 Elevated white blood cell count, unspecified: Secondary | ICD-10-CM | POA: Diagnosis not present

## 2017-09-25 DIAGNOSIS — R109 Unspecified abdominal pain: Secondary | ICD-10-CM | POA: Diagnosis not present

## 2017-10-03 ENCOUNTER — Ambulatory Visit (HOSPITAL_COMMUNITY): Payer: 59

## 2017-10-05 DIAGNOSIS — R1031 Right lower quadrant pain: Secondary | ICD-10-CM | POA: Diagnosis not present

## 2017-10-09 DIAGNOSIS — H66002 Acute suppurative otitis media without spontaneous rupture of ear drum, left ear: Secondary | ICD-10-CM | POA: Diagnosis not present

## 2017-10-09 DIAGNOSIS — J029 Acute pharyngitis, unspecified: Secondary | ICD-10-CM | POA: Diagnosis not present

## 2017-10-11 DIAGNOSIS — R102 Pelvic and perineal pain: Secondary | ICD-10-CM | POA: Diagnosis not present

## 2017-10-11 DIAGNOSIS — N736 Female pelvic peritoneal adhesions (postinfective): Secondary | ICD-10-CM | POA: Diagnosis not present

## 2017-10-17 DIAGNOSIS — R102 Pelvic and perineal pain: Secondary | ICD-10-CM | POA: Diagnosis not present

## 2017-10-25 DIAGNOSIS — R102 Pelvic and perineal pain: Secondary | ICD-10-CM | POA: Diagnosis not present

## 2017-11-01 DIAGNOSIS — R102 Pelvic and perineal pain: Secondary | ICD-10-CM | POA: Diagnosis not present

## 2017-11-06 DIAGNOSIS — Z0000 Encounter for general adult medical examination without abnormal findings: Secondary | ICD-10-CM | POA: Diagnosis not present

## 2017-11-06 DIAGNOSIS — E7800 Pure hypercholesterolemia, unspecified: Secondary | ICD-10-CM | POA: Diagnosis not present

## 2017-11-06 DIAGNOSIS — G43909 Migraine, unspecified, not intractable, without status migrainosus: Secondary | ICD-10-CM | POA: Diagnosis not present

## 2017-11-14 DIAGNOSIS — R102 Pelvic and perineal pain: Secondary | ICD-10-CM | POA: Diagnosis not present

## 2017-11-17 DIAGNOSIS — R1030 Lower abdominal pain, unspecified: Secondary | ICD-10-CM | POA: Diagnosis not present

## 2017-11-21 DIAGNOSIS — R1030 Lower abdominal pain, unspecified: Secondary | ICD-10-CM | POA: Diagnosis not present

## 2017-11-23 DIAGNOSIS — R1030 Lower abdominal pain, unspecified: Secondary | ICD-10-CM | POA: Diagnosis not present

## 2017-11-29 DIAGNOSIS — R1030 Lower abdominal pain, unspecified: Secondary | ICD-10-CM | POA: Diagnosis not present

## 2017-12-08 DIAGNOSIS — R1030 Lower abdominal pain, unspecified: Secondary | ICD-10-CM | POA: Diagnosis not present

## 2017-12-13 DIAGNOSIS — R1031 Right lower quadrant pain: Secondary | ICD-10-CM | POA: Diagnosis not present

## 2017-12-28 DIAGNOSIS — R102 Pelvic and perineal pain: Secondary | ICD-10-CM | POA: Diagnosis not present

## 2017-12-28 DIAGNOSIS — N898 Other specified noninflammatory disorders of vagina: Secondary | ICD-10-CM | POA: Diagnosis not present

## 2017-12-28 DIAGNOSIS — Z4003 Encounter for prophylactic removal of fallopian tube(s): Secondary | ICD-10-CM | POA: Diagnosis not present

## 2017-12-28 DIAGNOSIS — K660 Peritoneal adhesions (postprocedural) (postinfection): Secondary | ICD-10-CM | POA: Diagnosis not present

## 2018-05-20 IMAGING — US US ABDOMEN LIMITED
1 series · 14 of 25 positions shown · non-contrast
Comparison: CT scan 09/05/2017

CLINICAL DATA: Right upper quadrant pain for 10 days.

EXAM:
ULTRASOUND ABDOMEN LIMITED RIGHT UPPER QUADRANT

[Series 1: us abdomen limited · 0.12mm/px · 14 of 45 slices shown]
[im 1/45]
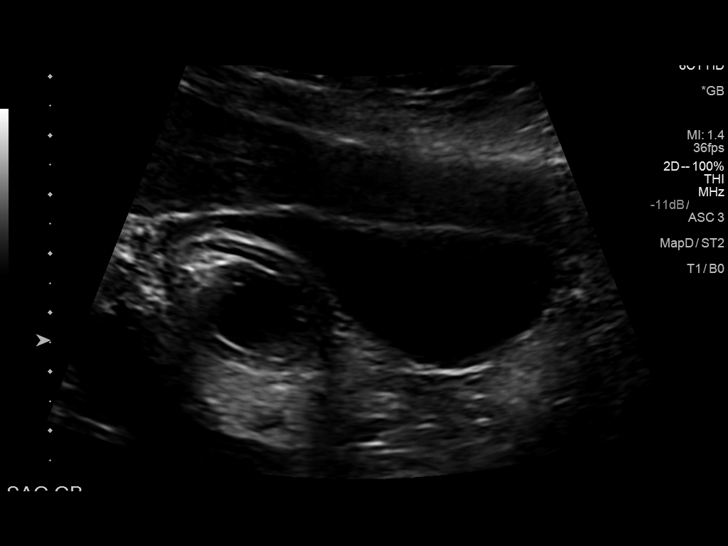
[im 4/45]
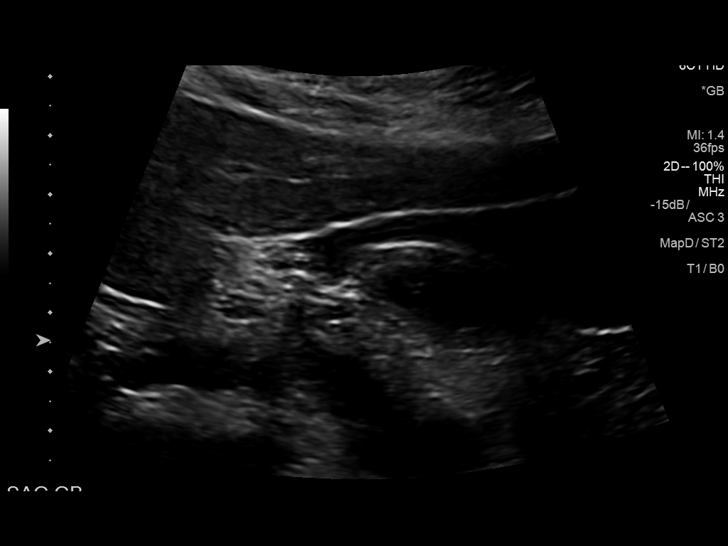
[im 8/45]
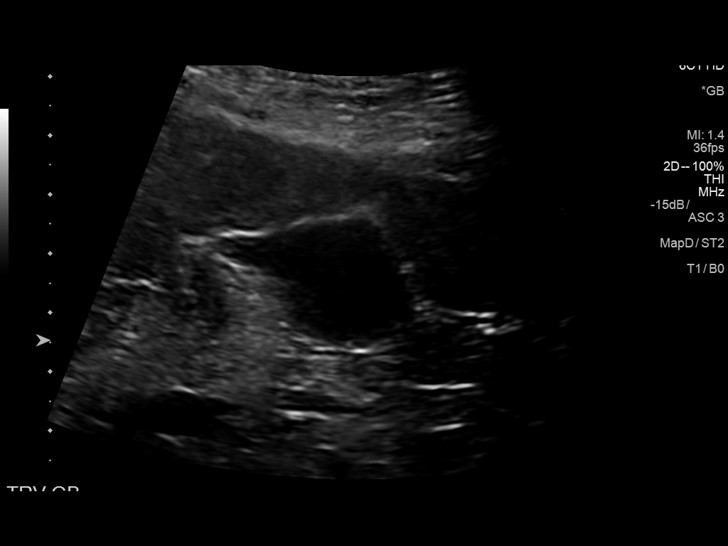
[im 12/45]
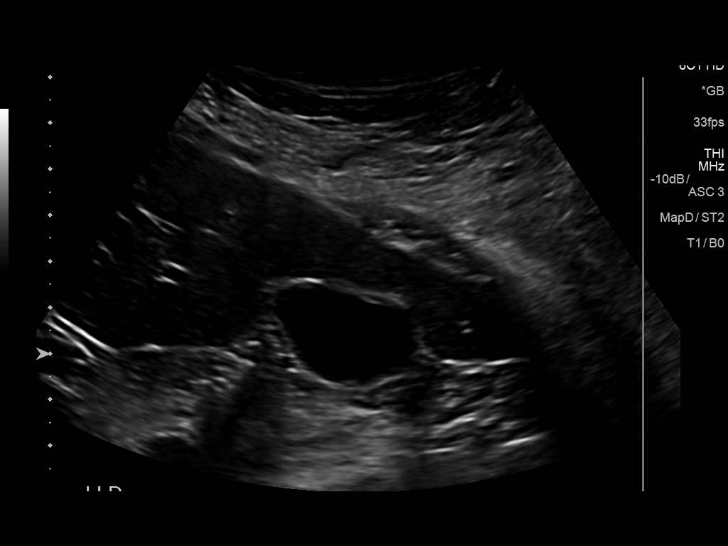
[im 15/45]
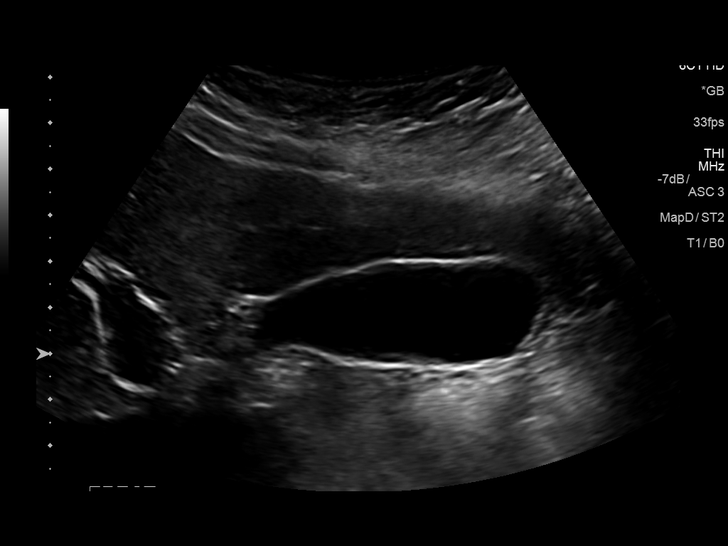
[im 17/45]
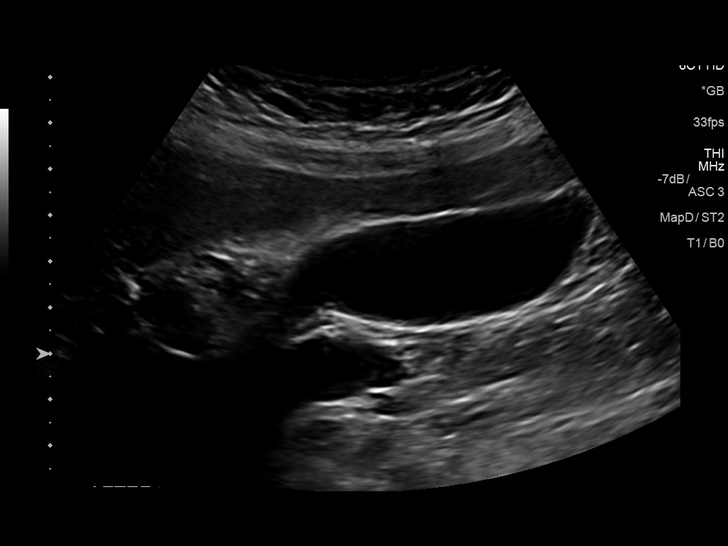
[im 21/45]
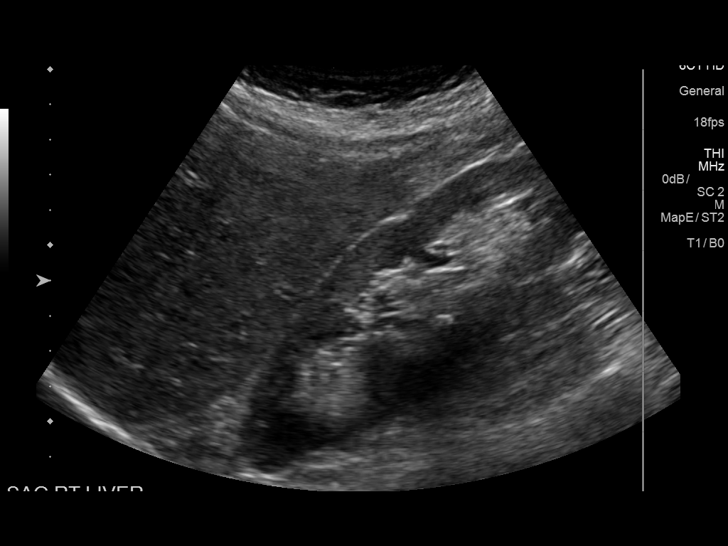
[im 24/45]
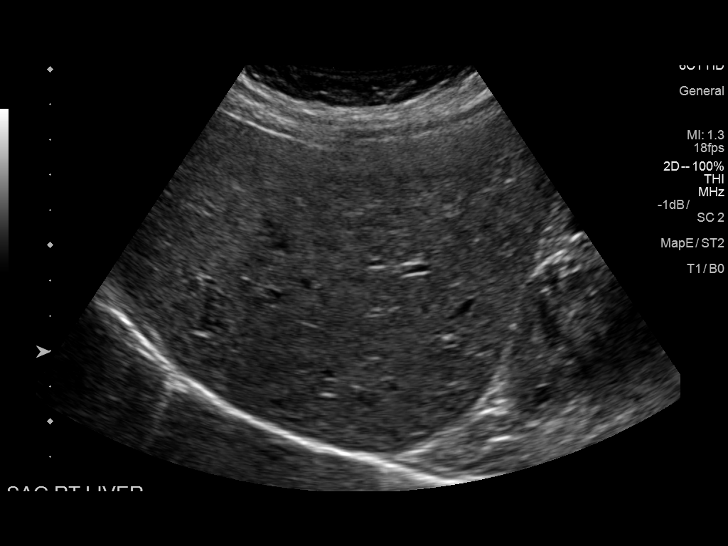
[im 28/45]
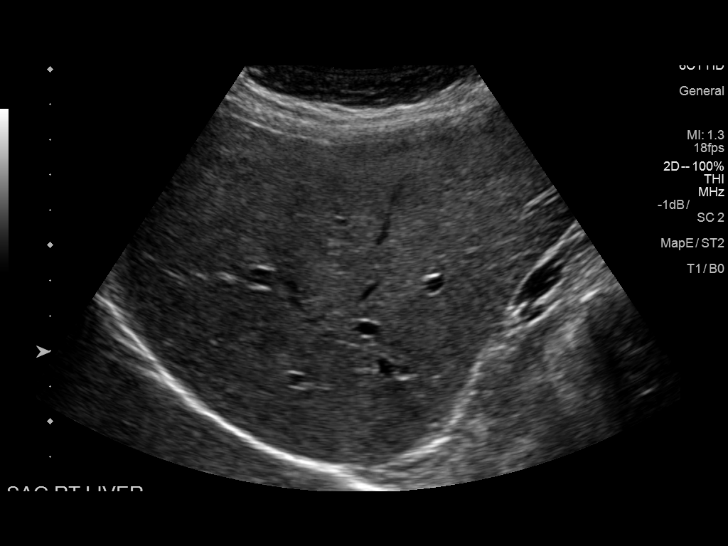
[im 30/45]
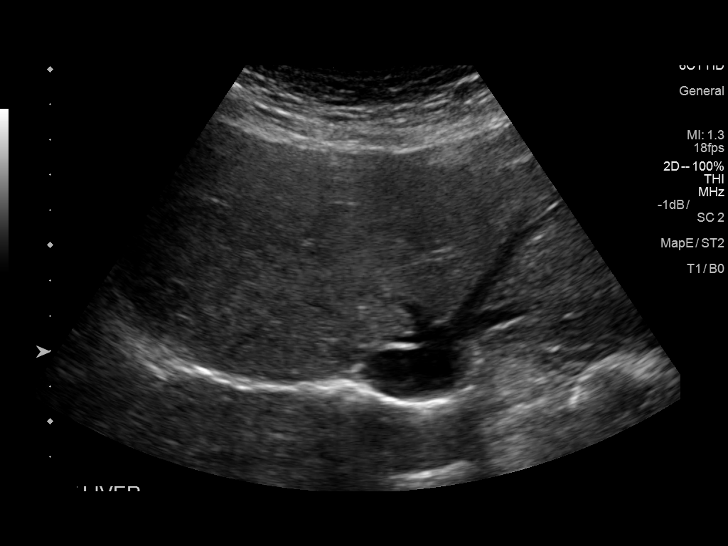
[im 34/45]
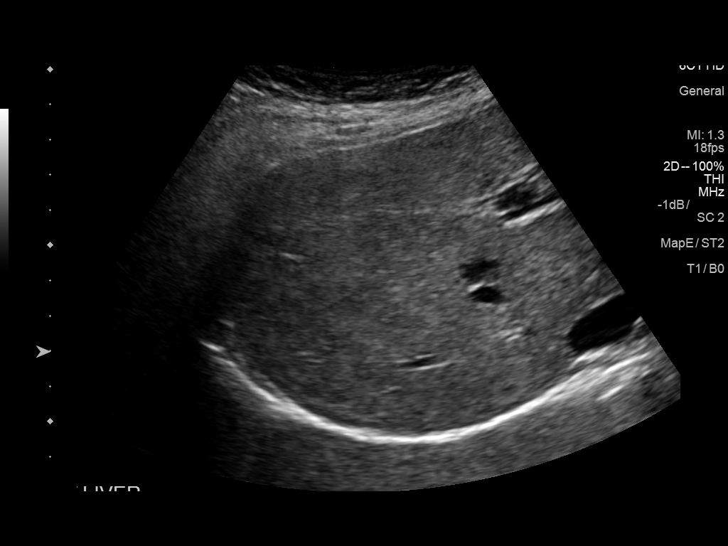
[im 37/45]
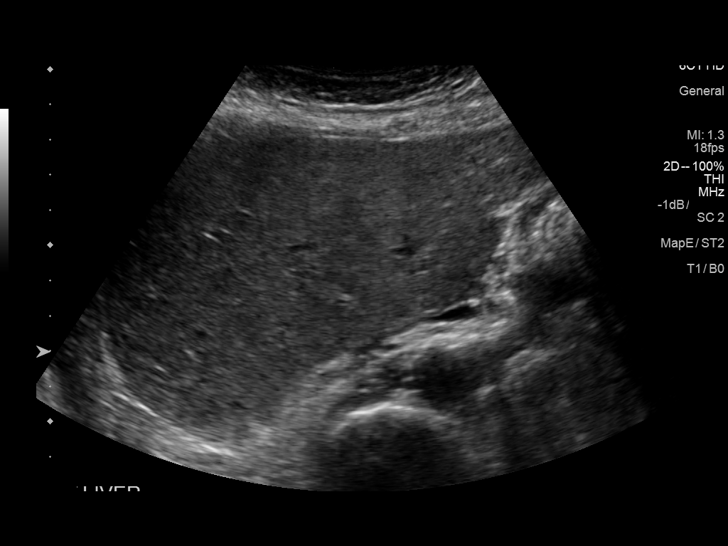
[im 41/45]
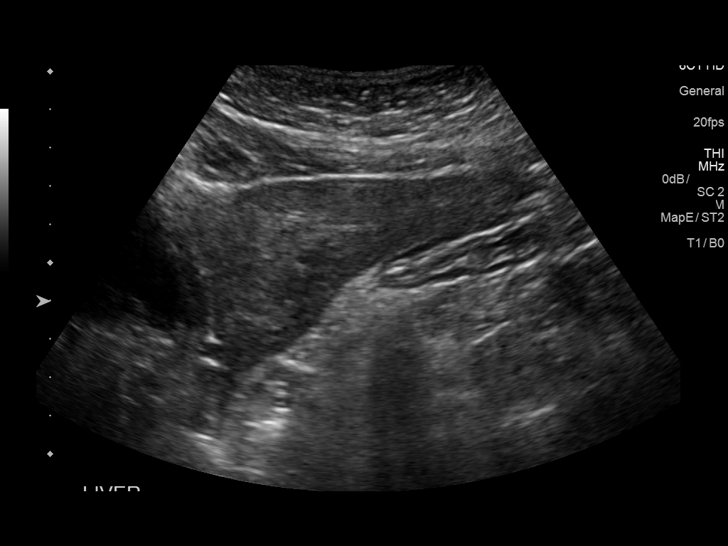
[im 45/45]
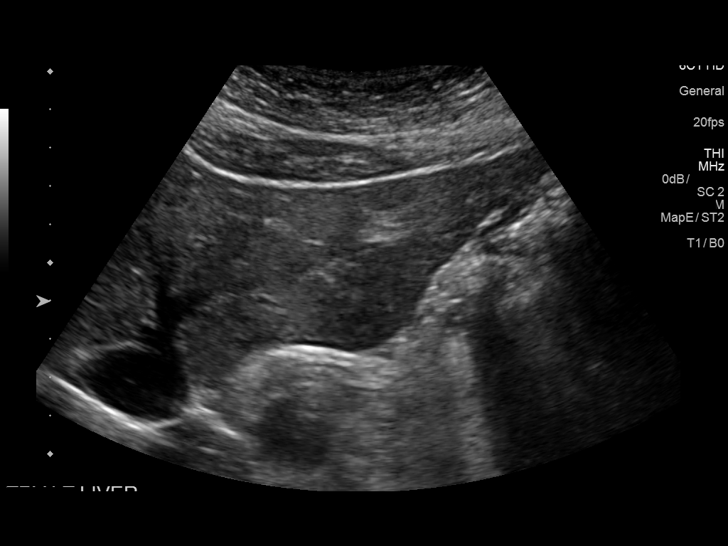

[14 of 25 positions shown; findings below may reference images not displayed]

FINDINGS: Gallbladder:

No gallstones or gallbladder wall thickening. No pericholecystic
fluid. The sonographer reports no sonographic Murphy's sign.

Common bile duct:

Diameter: 3 mm

Liver:

No focal lesion identified. Within normal limits in parenchymal
echogenicity. Portal vein is patent on color Doppler imaging with
normal direction of blood flow towards the liver.
IMPRESSION: Normal right upper quadrant ultrasound.

## 2018-09-10 DIAGNOSIS — Z23 Encounter for immunization: Secondary | ICD-10-CM | POA: Diagnosis not present

## 2019-04-08 DIAGNOSIS — Z0000 Encounter for general adult medical examination without abnormal findings: Secondary | ICD-10-CM | POA: Diagnosis not present

## 2019-05-24 ENCOUNTER — Emergency Department (HOSPITAL_BASED_OUTPATIENT_CLINIC_OR_DEPARTMENT_OTHER)
Admission: EM | Admit: 2019-05-24 | Discharge: 2019-05-24 | Disposition: A | Discharge status: Home / Self Care | Payer: 59 | Attending: Emergency Medicine | Admitting: Emergency Medicine

## 2019-05-24 ENCOUNTER — Encounter (HOSPITAL_BASED_OUTPATIENT_CLINIC_OR_DEPARTMENT_OTHER): Payer: Self-pay

## 2019-05-24 ENCOUNTER — Other Ambulatory Visit: Payer: Self-pay

## 2019-05-24 DIAGNOSIS — R197 Diarrhea, unspecified: Secondary | ICD-10-CM | POA: Diagnosis not present

## 2019-05-24 DIAGNOSIS — Z79899 Other long term (current) drug therapy: Secondary | ICD-10-CM | POA: Insufficient documentation

## 2019-05-24 DIAGNOSIS — R519 Headache, unspecified: Secondary | ICD-10-CM

## 2019-05-24 DIAGNOSIS — Z880 Allergy status to penicillin: Secondary | ICD-10-CM | POA: Insufficient documentation

## 2019-05-24 DIAGNOSIS — R51 Headache: Secondary | ICD-10-CM | POA: Insufficient documentation

## 2019-05-24 LAB — CBC WITH DIFFERENTIAL/PLATELET
Abs Immature Granulocytes: 0.04 10*3/uL (ref 0.00–0.07)
Basophils Absolute: 0 10*3/uL (ref 0.0–0.1)
Basophils Relative: 0 %
Eosinophils Absolute: 0.2 10*3/uL (ref 0.0–0.5)
Eosinophils Relative: 2 %
HCT: 39.8 % (ref 36.0–46.0)
Hemoglobin: 12.4 g/dL (ref 12.0–15.0)
Immature Granulocytes: 0 %
Lymphocytes Relative: 19 %
Lymphs Abs: 2.2 10*3/uL (ref 0.7–4.0)
MCH: 25.1 pg — ABNORMAL LOW (ref 26.0–34.0)
MCHC: 31.2 g/dL (ref 30.0–36.0)
MCV: 80.4 fL (ref 80.0–100.0)
Monocytes Absolute: 0.6 10*3/uL (ref 0.1–1.0)
Monocytes Relative: 5 %
Neutro Abs: 8.9 10*3/uL — ABNORMAL HIGH (ref 1.7–7.7)
Neutrophils Relative %: 74 %
Platelets: 356 10*3/uL (ref 150–400)
RBC: 4.95 MIL/uL (ref 3.87–5.11)
RDW: 15.8 % — ABNORMAL HIGH (ref 11.5–15.5)
WBC: 12 10*3/uL — ABNORMAL HIGH (ref 4.0–10.5)
nRBC: 0 % (ref 0.0–0.2)

## 2019-05-24 LAB — COMPREHENSIVE METABOLIC PANEL
ALT: 15 U/L (ref 0–44)
AST: 18 U/L (ref 15–41)
Albumin: 4.3 g/dL (ref 3.5–5.0)
Alkaline Phosphatase: 57 U/L (ref 38–126)
Anion gap: 9 (ref 5–15)
BUN: 11 mg/dL (ref 6–20)
CO2: 20 mmol/L — ABNORMAL LOW (ref 22–32)
Calcium: 9.4 mg/dL (ref 8.9–10.3)
Chloride: 108 mmol/L (ref 98–111)
Creatinine, Ser: 0.57 mg/dL (ref 0.44–1.00)
GFR calc Af Amer: >60 mL/min (ref >60)
GFR calc non Af Amer: >60 mL/min (ref >60)
Glucose, Bld: 119 mg/dL — ABNORMAL HIGH (ref 70–99)
Potassium: 3.8 mmol/L (ref 3.5–5.1)
Sodium: 137 mmol/L (ref 135–145)
Total Bilirubin: 0.7 mg/dL (ref 0.3–1.2)
Total Protein: 8.4 g/dL — ABNORMAL HIGH (ref 6.5–8.1)

## 2019-05-24 MED ORDER — DIPHENHYDRAMINE HCL 50 MG/ML IJ SOLN
12.5000 mg | Freq: Once | INTRAMUSCULAR | Status: AC
  Administered 2019-05-24: 12.5 mg via INTRAVENOUS
  Filled 2019-05-24: qty 1

## 2019-05-24 MED ORDER — LACTATED RINGERS IV BOLUS
1000.0000 mL | Freq: Once | INTRAVENOUS | Status: AC
  Administered 2019-05-24: 08:00:00 1000 mL via INTRAVENOUS

## 2019-05-24 MED ORDER — METOCLOPRAMIDE HCL 5 MG/ML IJ SOLN
10.0000 mg | Freq: Once | INTRAMUSCULAR | Status: AC
Start: 2019-05-24 — End: 2019-05-24
  Administered 2019-05-24: 10 mg via INTRAVENOUS
  Filled 2019-05-24: qty 2

## 2019-05-24 MED ORDER — DEXAMETHASONE SODIUM PHOSPHATE 10 MG/ML IJ SOLN
10.0000 mg | Freq: Once | INTRAMUSCULAR | Status: AC
  Administered 2019-05-24: 10:00:00 10 mg via INTRAVENOUS
  Filled 2019-05-24: qty 1

## 2019-05-24 NOTE — ED Provider Notes (Signed)
[place] EMERGENCY DEPARTMENT Provider Note   CSN: [id number] Arrival date & time: 05/24/19  3790    History   Chief Complaint Chief Complaint  Patient presents with  . Nausea  . Migraine    HPI Michelle Cameron is a 53 y.o. female.     The history is provided by the patient and medical records. No language interpreter was used.  Migraine   Michelle Cameron is a 53 y.o. female who presents to the Emergency Department complaining of migraine. She presents to the emergency department with complaints of headache that began last night. She began feeling poorly yesterday in the morning around 5 AM with significant diarrhea. Her diarrhea continued throughout the day until around midnight. In the evening yesterday she developed a gradual onset headache, which she describes as one of her typical migraine headaches. The headache is located in the occipital region and wraps around to the front of her head. Pain is dull and constant in nature. The diarrhea is watery, nonbloody. She denies any fevers, chest pain, shortness of breath, abdominal pain. No photophobia, vision changes, numbness, weakness. She thinks she may have had bad take-out food on Tuesday. No known sick contacts. No one else in the home is sick. No known coronavirus contacts. She did take a sumatriptan last night, with no significant improvement in symptoms. She does complain of severe and profound nausea. No chest pain. Past Medical History:  Diagnosis Date  . Migraine     There are no active problems to display for this patient.   Past Surgical History:  Procedure Laterality Date  . ABDOMINAL HYSTERECTOMY    . CESAREAN SECTION       OB History   No obstetric history on file.      Home Medications    Prior to Admission medications   Medication Sig Start Date End Date Taking? Authorizing Provider  buPROPion (WELLBUTRIN XL) 150 MG 24 hr tablet Take 150 mg by mouth daily.    [provider]   busPIRone (BUSPAR) 30 MG tablet Take 30 mg by mouth 2 (two) times daily.    [provider]  doxycycline (VIBRAMYCIN) 50 MG capsule Take 1 capsule (50 mg total) by mouth 2 (two) times daily. 12/15/15   Kozlow, Donnamarie Poag, MD  montelukast (SINGULAIR) 10 MG tablet Take 1 tablet (10 mg total) by mouth at bedtime. 12/16/15   Kozlow, Donnamarie Poag, MD    Family History History reviewed. No pertinent family history.  Social History Social History   Tobacco Use  . Smoking status: Never Smoker  . Smokeless tobacco: Never Used  Substance Use Topics  . Alcohol use: No  . Drug use: No     Allergies   Penicillins   Review of Systems Review of Systems  All other systems reviewed and are negative.    Physical Exam Updated Vital Signs BP 123/75 (BP Location: Right Arm)   Pulse 74   Temp 98 F (36.7 C) (Oral)   Resp 16   Ht 5\' 6"  (1.676 m)   Wt 102.1 kg   SpO2 99%   BMI 36.32 kg/m   Physical Exam Vitals signs and nursing note reviewed.  Constitutional:      Appearance: She is well-developed.  HENT:     Head: Normocephalic and atraumatic.  Eyes:     Extraocular Movements: Extraocular movements intact.     Pupils: Pupils are equal, round, and reactive to light.  Neck:     Musculoskeletal:  Neck supple.  Cardiovascular:     Rate and Rhythm: Normal rate and regular rhythm.  Pulmonary:     Effort: Pulmonary effort is normal. No respiratory distress.  Abdominal:     Palpations: Abdomen is soft.     Tenderness: There is no abdominal tenderness. There is no guarding or rebound.  Musculoskeletal:        General: No swelling or tenderness.  Skin:    General: Skin is warm and dry.  Neurological:     Mental Status: She is alert and oriented to person, place, and time.     Comments: No asymmetry of facial movements. Five out of five strength in all four extremities with sensation to light touch intact in all four extremities  Psychiatric:        Behavior: Behavior normal.       ED Treatments / Results  Labs (all labs ordered are listed, but only abnormal results are displayed) Labs Reviewed  COMPREHENSIVE METABOLIC PANEL - Abnormal; Notable for the following components:      Result Value   CO2 20 (*)    Glucose, Bld 119 (*)    Total Protein 8.4 (*)    All other components within normal limits  CBC WITH DIFFERENTIAL/PLATELET - Abnormal; Notable for the following components:   WBC 12.0 (*)    MCH 25.1 (*)    RDW 15.8 (*)    Neutro Abs 8.9 (*)    All other components within normal limits    EKG None  Radiology No results found.  Procedures Procedures (including critical care time)  Medications Ordered in ED Medications  lactated ringers bolus 1,000 mL (0 mLs Intravenous Stopped 05/24/19 0937)  metoCLOPramide (REGLAN) injection 10 mg (10 mg Intravenous Given 05/24/19 0805)  diphenhydrAMINE (BENADRYL) injection 12.5 mg (12.5 mg Intravenous Given 05/24/19 0803)  dexamethasone (DECADRON) injection 10 mg (10 mg Intravenous Given 05/24/19 0936)     Initial Impression / Assessment and Plan / ED Course  I have reviewed the triage vital signs and the nursing notes.  Pertinent labs & imaging results that were available during my care of the patient were reviewed by me and considered in my medical decision making (see chart for details).        Patient with history of migraine headaches here for evaluation of headaches similar to her prior migraines as well as diarrhea. She developed vomiting on ED arrival. She is non-toxic appearing on evaluation with no focal neurologic deficits. She was treated with IV fluids, antiemetics with improvement in her symptoms. On reassessment her headache is resolved and she is able to tolerate oral fluids without difficulty. Labs demonstrate mild leukocytosis. Current presentation is not consistent with meningitis, subarachnoid hemorrhage, CVA, dissection, diverticulitis, appendicitis. Counseled patient on home care for migraine  headache. Discussed outpatient follow-up as well as return precautions.  Final Clinical Impressions(s) / ED Diagnoses   Final diagnoses:  Bad headache  Diarrhea, unspecified type    ED Discharge Orders    None       Tilden Fossaees, Kariss Longmire, MD 05/24/19 1319

## 2019-05-24 NOTE — ED Notes (Signed)
Pt reports feeling better.  Headache improved to 1or 2/10, Nausea resolved.  Tolerating po fluids.

## 2019-05-24 NOTE — ED Notes (Signed)
Call to [facility] with patients permission to provide update.

## 2019-05-24 NOTE — ED Notes (Signed)
PO fluid challenge in progress

## 2019-05-24 NOTE — ED Triage Notes (Signed)
Pt states for past 24 hours had GI upset, nausea, diarrhea, which triggered migraine. Pt took usual migraine medicine without relief.

## 2019-11-08 ENCOUNTER — Other Ambulatory Visit: Payer: Self-pay

## 2019-11-08 DIAGNOSIS — Z20822 Contact with and (suspected) exposure to covid-19: Secondary | ICD-10-CM

## 2019-11-11 LAB — NOVEL CORONAVIRUS, NAA: SARS-CoV-2, NAA: NOT DETECTED

## 2019-11-14 ENCOUNTER — Other Ambulatory Visit: Payer: Self-pay | Admitting: Family Medicine

## 2019-12-05 ENCOUNTER — Ambulatory Visit: Payer: 59

## 2020-04-07 ENCOUNTER — Other Ambulatory Visit: Payer: Self-pay | Admitting: Family Medicine

## 2020-04-07 DIAGNOSIS — Z1231 Encounter for screening mammogram for malignant neoplasm of breast: Secondary | ICD-10-CM

## 2020-04-21 ENCOUNTER — Ambulatory Visit
Admission: RE | Admit: 2020-04-21 | Discharge: 2020-04-21 | Disposition: A | Discharge status: Home / Self Care | Payer: 59 | Source: Ambulatory Visit | Attending: Family Medicine | Admitting: Family Medicine

## 2020-04-21 ENCOUNTER — Other Ambulatory Visit: Payer: Self-pay

## 2020-04-21 DIAGNOSIS — Z1231 Encounter for screening mammogram for malignant neoplasm of breast: Secondary | ICD-10-CM

## 2022-03-15 ENCOUNTER — Other Ambulatory Visit: Payer: Self-pay | Admitting: Family Medicine

## 2022-03-15 DIAGNOSIS — Z1231 Encounter for screening mammogram for malignant neoplasm of breast: Secondary | ICD-10-CM

## 2022-03-18 ENCOUNTER — Ambulatory Visit
Admission: RE | Admit: 2022-03-18 | Discharge: 2022-03-18 | Disposition: A | Discharge status: Home / Self Care | Payer: 59 | Source: Ambulatory Visit | Attending: Family Medicine | Admitting: Family Medicine

## 2022-03-18 DIAGNOSIS — Z1231 Encounter for screening mammogram for malignant neoplasm of breast: Secondary | ICD-10-CM

## 2024-02-11 IMAGING — MG MM DIGITAL SCREENING BILAT W/ TOMO AND CAD
8 series · 8 of 24 positions shown · non-contrast
Comparison: Previous exam(s).

CLINICAL DATA: Screening.

EXAM:
DIGITAL SCREENING BILATERAL MAMMOGRAM WITH TOMOSYNTHESIS AND CAD
TECHNIQUE: Bilateral screening digital craniocaudal and mediolateral oblique
mammograms were obtained. Bilateral screening digital breast
tomosynthesis was performed. The images were evaluated with
computer-aided detection.

[R CC synth-2D]
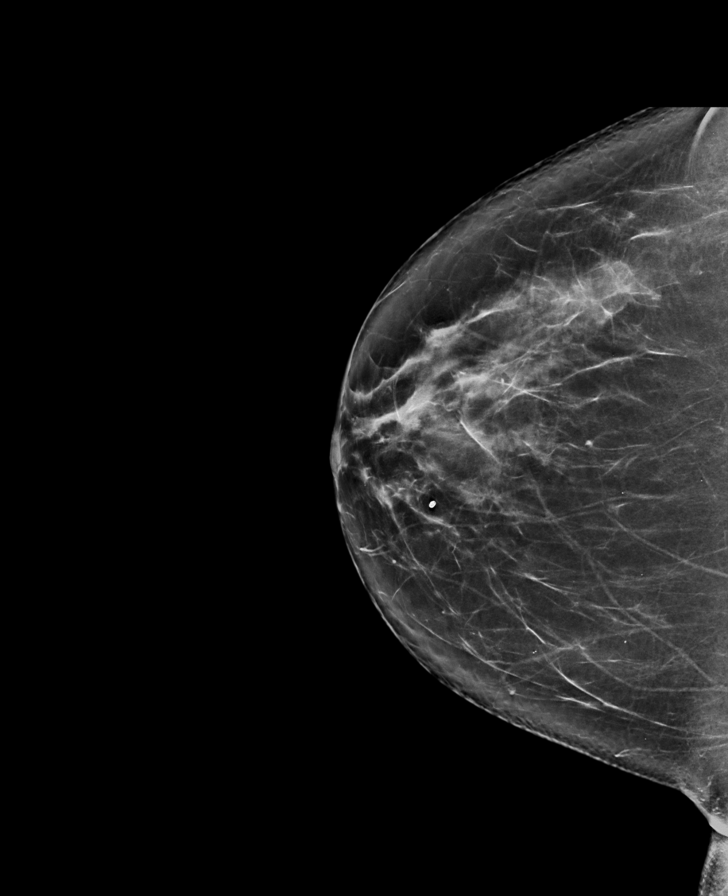

[L MLO synth-2D]
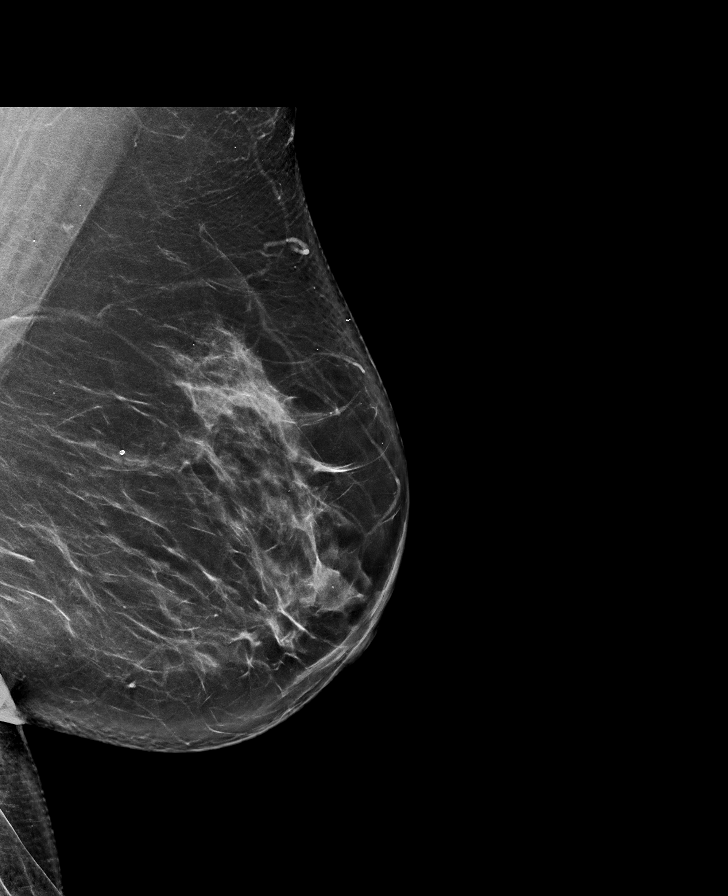

[R MLO synth-2D]
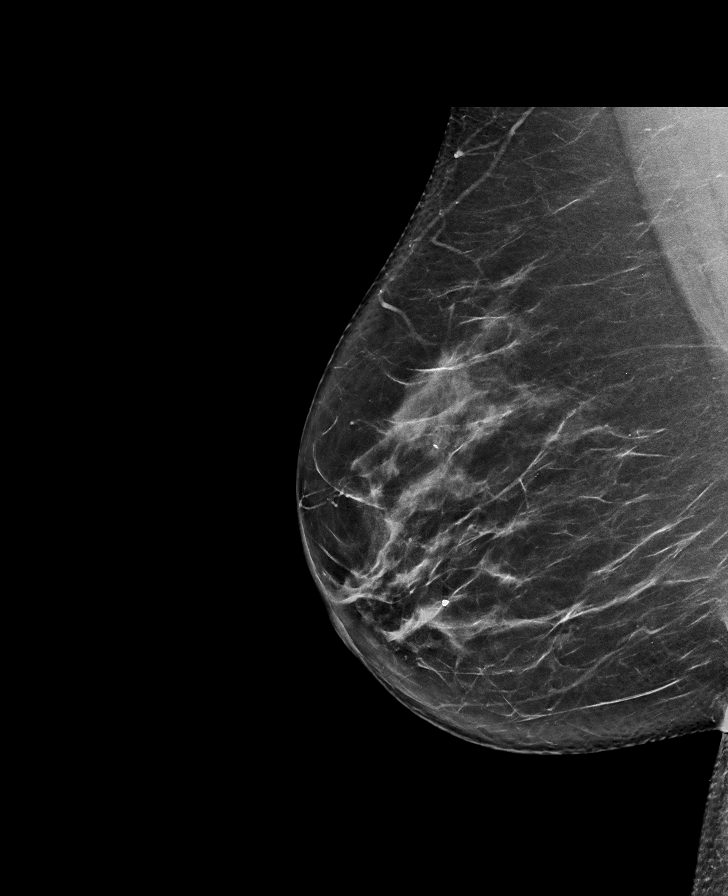

[L CC synth-2D]
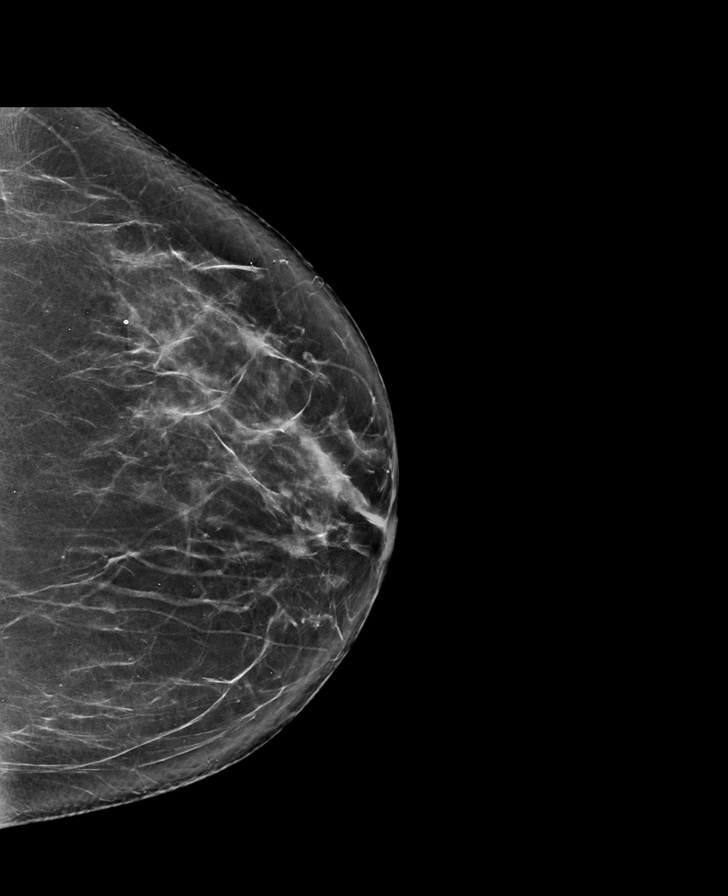

[L MLO tomo · tomo slice 45/90.0]
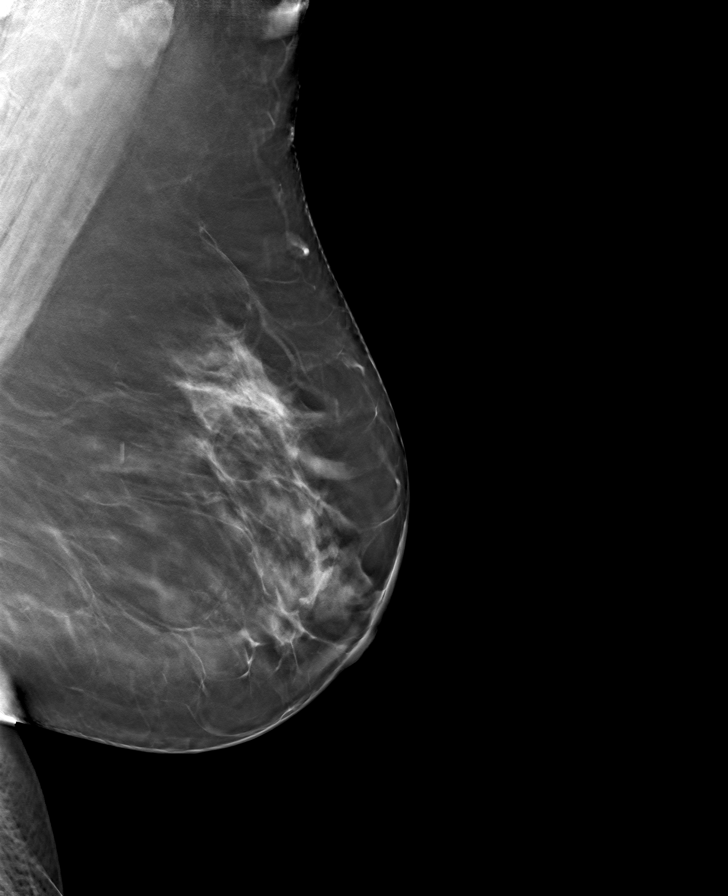

[R MLO tomo · tomo slice 43/84.0]
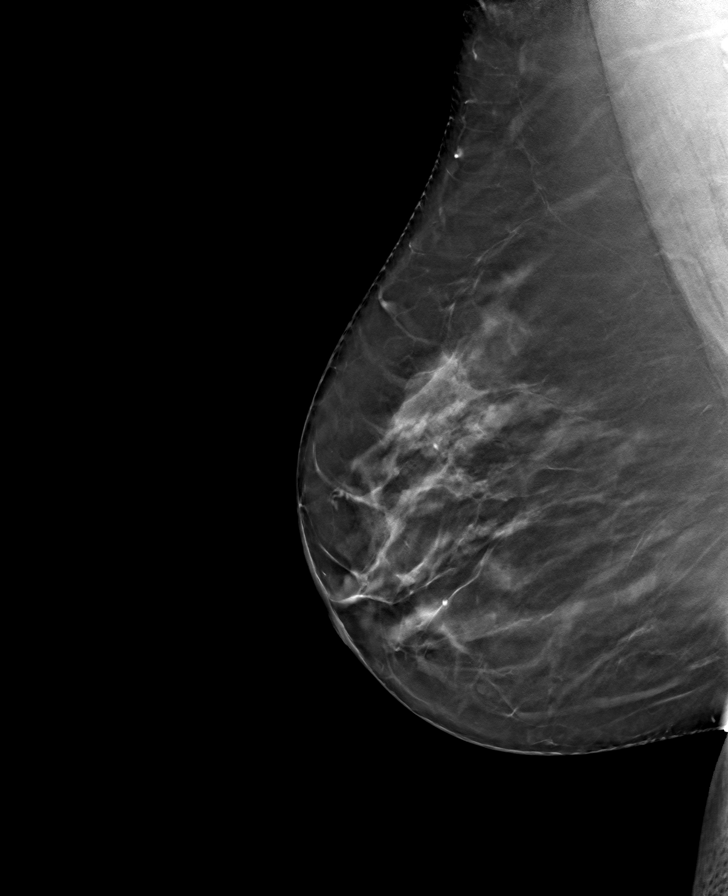

[L CC tomo · tomo slice 39/78.0]
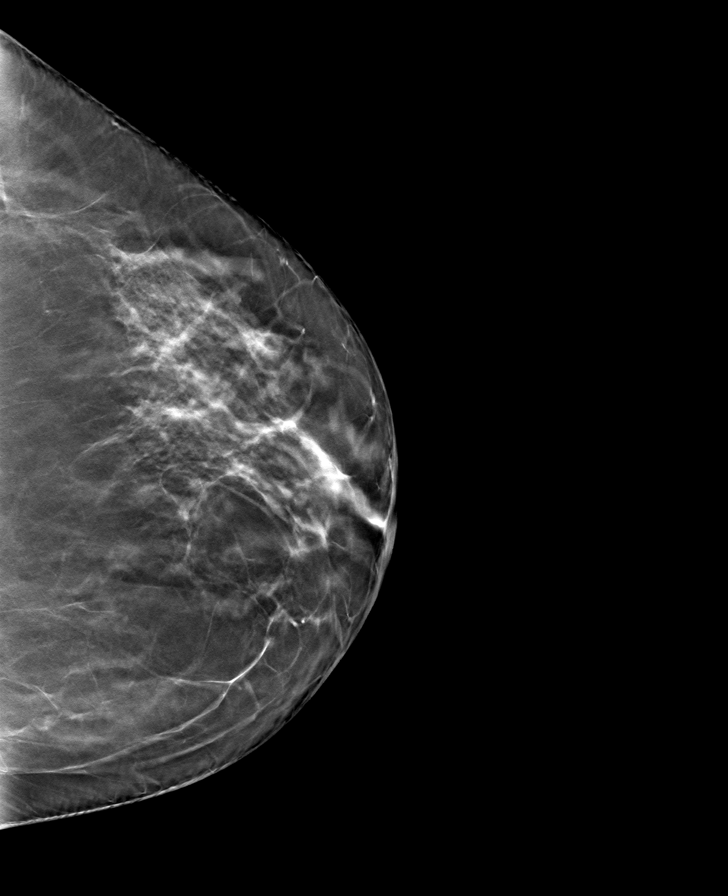

[R CC tomo · tomo slice 41/82.0]
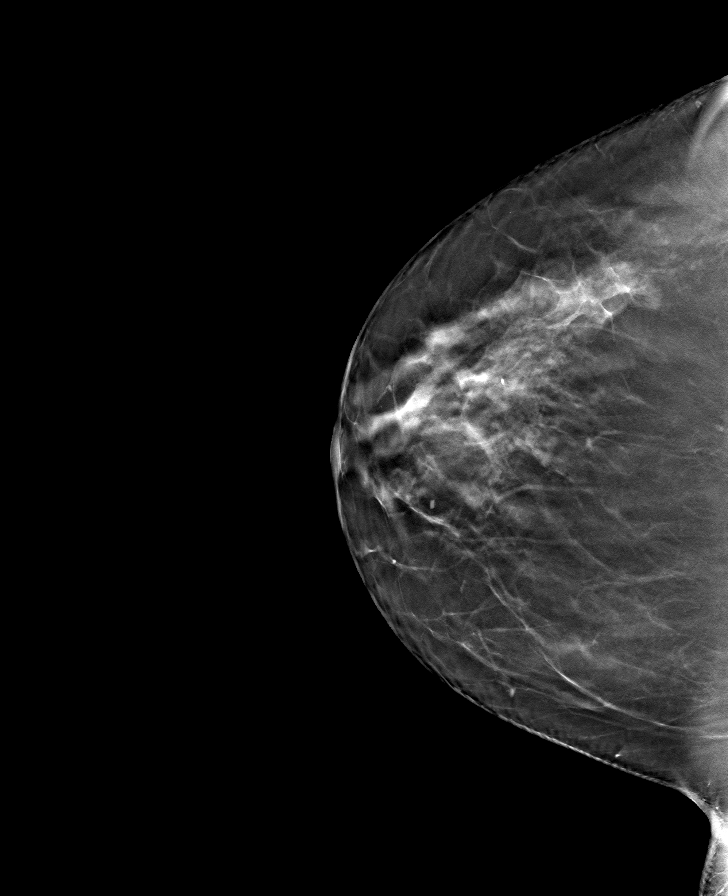

[8 of 24 positions shown; findings below may reference images not displayed]

ACR Breast Density Category b: There are scattered areas of
fibroglandular density.
FINDINGS: There are no findings suspicious for malignancy.
IMPRESSION: No mammographic evidence of malignancy. A result letter of this
screening mammogram will be mailed directly to the patient.

RECOMMENDATION:
Screening mammogram in one year. (Code:51-O-LD2)

BI-RADS CATEGORY  1: Negative.

## 2024-03-27 ENCOUNTER — Other Ambulatory Visit: Payer: Self-pay | Admitting: Family Medicine

## 2024-03-27 DIAGNOSIS — Z1231 Encounter for screening mammogram for malignant neoplasm of breast: Secondary | ICD-10-CM

## 2024-04-04 ENCOUNTER — Ambulatory Visit

## 2024-04-05 ENCOUNTER — Ambulatory Visit
Admission: RE | Admit: 2024-04-05 | Discharge: 2024-04-05 | Disposition: A | Discharge status: Home / Self Care | Source: Ambulatory Visit | Attending: Family Medicine | Admitting: Family Medicine

## 2024-04-05 DIAGNOSIS — Z1231 Encounter for screening mammogram for malignant neoplasm of breast: Secondary | ICD-10-CM

## 2024-04-24 ENCOUNTER — Emergency Department (HOSPITAL_BASED_OUTPATIENT_CLINIC_OR_DEPARTMENT_OTHER)
Admission: EM | Admit: 2024-04-24 | Discharge: 2024-04-24 | Disposition: A | Discharge status: Home / Self Care | Attending: Emergency Medicine | Admitting: Emergency Medicine

## 2024-04-24 ENCOUNTER — Emergency Department (HOSPITAL_BASED_OUTPATIENT_CLINIC_OR_DEPARTMENT_OTHER): Admitting: Radiology

## 2024-04-24 ENCOUNTER — Encounter (HOSPITAL_BASED_OUTPATIENT_CLINIC_OR_DEPARTMENT_OTHER): Payer: Self-pay | Admitting: Emergency Medicine

## 2024-04-24 ENCOUNTER — Emergency Department (HOSPITAL_BASED_OUTPATIENT_CLINIC_OR_DEPARTMENT_OTHER)

## 2024-04-24 ENCOUNTER — Other Ambulatory Visit: Payer: Self-pay

## 2024-04-24 DIAGNOSIS — M546 Pain in thoracic spine: Secondary | ICD-10-CM | POA: Insufficient documentation

## 2024-04-24 DIAGNOSIS — R079 Chest pain, unspecified: Secondary | ICD-10-CM | POA: Diagnosis not present

## 2024-04-24 DIAGNOSIS — R739 Hyperglycemia, unspecified: Secondary | ICD-10-CM | POA: Insufficient documentation

## 2024-04-24 HISTORY — DX: Anxiety disorder, unspecified: F41.9

## 2024-04-24 LAB — CBC
HCT: 44.8 % (ref 36.0–46.0)
Hemoglobin: 14.9 g/dL (ref 12.0–15.0)
MCH: 28.1 pg (ref 26.0–34.0)
MCHC: 33.3 g/dL (ref 30.0–36.0)
MCV: 84.4 fL (ref 80.0–100.0)
Platelets: 321 10*3/uL (ref 150–400)
RBC: 5.31 MIL/uL — ABNORMAL HIGH (ref 3.87–5.11)
RDW: 14.3 % (ref 11.5–15.5)
WBC: 12.4 10*3/uL — ABNORMAL HIGH (ref 4.0–10.5)
nRBC: 0 % (ref 0.0–0.2)

## 2024-04-24 LAB — BASIC METABOLIC PANEL WITH GFR
Anion gap: 16 — ABNORMAL HIGH (ref 5–15)
BUN: 16 mg/dL (ref 6–20)
CO2: 21 mmol/L — ABNORMAL LOW (ref 22–32)
Calcium: 9.9 mg/dL (ref 8.9–10.3)
Chloride: 102 mmol/L (ref 98–111)
Creatinine, Ser: 0.75 mg/dL (ref 0.44–1.00)
GFR, Estimated: >60 mL/min (ref >60)
Glucose, Bld: 249 mg/dL — ABNORMAL HIGH (ref 70–99)
Potassium: 4.1 mmol/L (ref 3.5–5.1)
Sodium: 138 mmol/L (ref 135–145)

## 2024-04-24 LAB — TROPONIN T, HIGH SENSITIVITY
Troponin T High Sensitivity: <15 ng/L (ref <19)
Troponin T High Sensitivity: <15 ng/L (ref <19)

## 2024-04-24 MED ORDER — SODIUM CHLORIDE 0.9 % IV BOLUS
500.0000 mL | Freq: Once | INTRAVENOUS | Status: AC
  Administered 2024-04-24: 500 mL via INTRAVENOUS

## 2024-04-24 MED ORDER — CYCLOBENZAPRINE HCL 10 MG PO TABS: 10.0000 mg | ORAL_TABLET | Freq: Three times a day (TID) | ORAL | 0 refills | Status: AC | PRN

## 2024-04-24 MED ORDER — OMEPRAZOLE 20 MG PO CPDR: 20.0000 mg | DELAYED_RELEASE_CAPSULE | Freq: Every day | ORAL | 0 refills | Status: AC

## 2024-04-24 MED ORDER — IOHEXOL 350 MG/ML SOLN
100.0000 mL | Freq: Once | INTRAVENOUS | Status: AC | PRN
  Administered 2024-04-24: 100 mL via INTRAVENOUS

## 2024-04-24 NOTE — Discharge Instructions (Addendum)
 1.  Schedule follow-up with your doctor for recheck in about a week. 2.  Your CT scan did not show any evidence of blood clot in your lungs or other immediate abnormalities.  Your EKG and heart labs did not suggest a heart attack at this time.  Continue to follow-up with your doctor to monitor your symptoms and to make sure you do not need additional evaluation for any of these conditions. 3.  You have been given omeprazole to try for possible gastroesophageal reflux disease.  Review the instructions in your discharge summary.  Gastroesophageal reflux disease can have many different types of symptoms.  It is not always a typical burning that people think of. 4.  You have been given Flexeril to take for back muscle spasms.  Take as prescribed.  You may also take extra strength Tylenol for pain.  At this time, try to avoid nonsteroidal anti-inflammatories like Motrin\ibuprofen, Aleve\naproxen as these may worsen symptoms of gastroesophageal reflux disease. 5.  Your blood sugar was elevated at 249.  This may be due to your recent steroid therapy for your back pain.  This will need to be monitored closely with your doctor.  I did not find any other recent blood values.  Your doctor needs to review this and see if they have been monitoring your blood sugar and A1c.  If this was a sudden change after taking prednisone it may go back down to normal, otherwise you may have diabetes and need further monitoring and treatment.

## 2024-04-24 NOTE — ED Provider Notes (Signed)
 [place] EMERGENCY DEPARTMENT AT [facility] Provider Note   CSN: [id number] Arrival date & time: 04/24/24  1538     History  Chief Complaint  Patient presents with   Chest Pain    Michelle Cameron is a 58 y.o. female.  HPI Patient reports has been having variable migratory areas of thoracic back pain back-and-forth from both of her shoulders.  She reports this has been going on for a few weeks and she attributes it to musculoskeletal pain.  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  She reports however she also started last week getting pain in her chest.  She reports it was in her anterior chest and she was seen by another outpatient provider.  At that time they thought it was probably musculoskeletal chest pain as well.  She reports since that time she has had increasing sharp pains into the right chest as well as feeling some difficulty getting a deep breath sometimes.  She was prescribed a steroid pack for her back pain and feels like she has gotten more anxious.  She is not sure if it is from the steroids or just her anxiety or the chest pain.  Patient denies any personal history of PE.  She denies that she has any lower extremity swelling or calf pain.  Patient reports that today she also had an episode where her heart rate seemed to pick up.  She reports she could feel her heart pounding harder.  She did have her Apple watch on reports her heart rate was about 117.  It is back down again.  She reports combined with worsening chest pain, she got worried and decided to come for a reevaluation of chest pain.    Home Medications Prior to Admission medications   Medication Sig Start Date End Date Taking? Authorizing Provider  cyclobenzaprine (FLEXERIL) 10 MG tablet Take 1 tablet (10 mg total) by mouth 3 (three) times daily as needed for muscle spasms. 04/24/24  Yes Wynetta Heckle, MD  omeprazole (PRILOSEC) 20 MG capsule Take 1  capsule (20 mg total) by mouth daily. 04/24/24  Yes Wynetta Heckle, MD  buPROPion (WELLBUTRIN XL) 150 MG 24 hr tablet Take 150 mg by mouth daily.    [provider]  busPIRone (BUSPAR) 30 MG tablet Take 30 mg by mouth 2 (two) times daily.    [provider]  doxycycline  (VIBRAMYCIN ) 50 MG capsule Take 1 capsule (50 mg total) by mouth 2 (two) times daily. 12/15/15   Kozlow, Rema Care, MD  montelukast  (SINGULAIR ) 10 MG tablet Take 1 tablet (10 mg total) by mouth at bedtime. 12/16/15   Kozlow, Rema Care, MD      Allergies    Penicillins    Review of Systems   Review of Systems  Physical Exam Updated Vital Signs BP 110/75   Pulse 83   Temp 98.7 F (37.1 C) (Oral)   Resp 14   Ht 5\' 6"  (1.676 m)   Wt 114.3 kg   SpO2 97%   BMI 40.67 kg/m  Physical Exam Constitutional:      Comments: Alert nontoxic.  Clear mental status.  No respiratory distress.  HENT:     Head: Normocephalic and atraumatic.     Mouth/Throat:     Pharynx: Oropharynx is clear.  Cardiovascular:     Rate and Rhythm: Normal rate and regular rhythm.     Heart sounds: Normal heart sounds.  Pulmonary:     Effort: Pulmonary effort is normal.     Breath sounds: Normal breath sounds.  Abdominal:     General: There is no distension.     Palpations: Abdomen is soft.     Tenderness: There is no abdominal tenderness. There is no guarding.  Musculoskeletal:        General: No swelling or tenderness. Normal range of motion.     Right lower leg: No edema.     Left lower leg: No edema.  Skin:    General: Skin is warm and dry.  Neurological:     General: No focal deficit present.     Mental Status: She is oriented to person, place, and time.     Motor: No weakness.     Coordination: Coordination normal.  Psychiatric:        Mood and Affect: Mood normal.     ED Results / Procedures / Treatments   Labs (all labs ordered are listed, but only abnormal results are displayed) Labs Reviewed  BASIC METABOLIC  PANEL WITH GFR - Abnormal; Notable for the following components:      Result Value   CO2 21 (*)    Glucose, Bld 249 (*)    Anion gap 16 (*)    All other components within normal limits  CBC - Abnormal; Notable for the following components:   WBC 12.4 (*)    RBC 5.31 (*)    All other components within normal limits  TROPONIN T, HIGH SENSITIVITY  TROPONIN T, HIGH SENSITIVITY    EKG EKG Interpretation Date/Time:  Wednesday Apr 24 2024 15:44:28 EDT Ventricular Rate:  117 PR Interval:  144 QRS Duration:  78 QT Interval:  302 QTC Calculation: 421 R Axis:   63  Text Interpretation: Sinus tachycardia no acute ischemic appearance No previous ECGs available Confirmed by Wynetta Heckle [phone]) on 04/24/2024 5:19:49 PM  Radiology CT Angio Chest PE W/Cm &/Or Wo Cm Result Date: 04/24/2024 CLINICAL DATA:  High probability for PE. EXAM: CT ANGIOGRAPHY CHEST WITH CONTRAST TECHNIQUE: Multidetector CT imaging of the chest was performed using the standard protocol during bolus administration of intravenous contrast. Multiplanar CT image reconstructions and MIPs were obtained to evaluate the vascular anatomy. RADIATION DOSE REDUCTION: This exam was performed according to the departmental dose-optimization program which includes automated exposure control, adjustment of the mA and/or kV according to patient size and/or use of iterative reconstruction technique. CONTRAST:  OMNIPAQUE IOHEXOL 350 MG/ML SOLN COMPARISON:  Chest x-ray 04/24/2024. FINDINGS: Cardiovascular: Satisfactory opacification of the pulmonary arteries to the segmental level. No evidence of pulmonary embolism. Normal heart size. No pericardial effusion. Mediastinum/Nodes: No enlarged mediastinal, hilar, or axillary lymph nodes. Thyroid  gland, trachea, and esophagus demonstrate no significant findings. Lungs/Pleura: Lungs are clear. No pleural effusion or pneumothorax. Upper Abdomen: No acute abnormality. Musculoskeletal: No chest wall  abnormality. No acute or significant osseous findings. Review of the MIP images confirms the above findings. IMPRESSION: No evidence for pulmonary embolism or other acute intrathoracic process. Electronically Signed   By: Tyron Gallon M.D.   On: 04/24/2024 19:18   DG Chest 2 View Result Date: 04/24/2024 CLINICAL DATA:  cp EXAM: CHEST - 2 VIEW COMPARISON:  None available. FINDINGS: No focal airspace consolidation, pleural effusion, or pneumothorax. No cardiomegaly. No acute fracture or destructive lesion. Multilevel thoracic osteophytosis. IMPRESSION: No acute cardiopulmonary abnormality. Electronically Signed   By: Rance Burrows M.D.   On: 04/24/2024 17:24    Procedures Procedures  Medications Ordered in ED Medications  sodium chloride 0.9 % bolus 500 mL (0 mLs Intravenous Stopped 04/24/24 1858)  iohexol (OMNIPAQUE) 350 MG/ML injection 100 mL (100 mLs Intravenous Contrast Given 04/24/24 1810)    ED Course/ Medical Decision Making/ A&P                                 Medical Decision Making Amount and/or Complexity of Data Reviewed Labs: ordered. Radiology: ordered.  Risk Prescription drug management.   Patient presents as outlined.  She has been experiencing some episodes of tachycardia.  In emergency department initial heart rate was 113.  In conjunction with this there has been chest pain and thoracic pain.  Differential diagnosis includes musculoskeletal pain\PE\ACS\dissection.  Will proceed with diagnostic evaluation.  Clinically patient has no respiratory distress.  Oxygen saturation documented range 93 to 99%.  Does not require supplemental oxygen currently.  Will continue to observe and follow diagnostic results.  CT PE study does not show acute abnormality.  Troponin negative x 2.  EKG does not show acute ischemic pattern.  I have recommended patient follow-up with PCP regarding any further cardiac monitoring.  At this time stable for continued outpatient follow-up.  The  patient has had musculoskeletal back pain.  Will treat with Flexeril.  The patient reports she is also getting scheduled for physical therapy.  We also discussed possibility of gastroesophageal reflux disease with central chest discomfort and radiation of pain.  Patient will trial a PPI and have close follow-up with PCP.  Patient has blood sugar elevated at 249.  She just finished a course of prednisone.  I did not find values in EMR for any recent A1c or CBGs.  Patient is made aware that this will need to be closely monitored to determine if she is diabetic.  She has just finished her prednisone and will not be taking anymore.  Stable to follow-up with PCP for CBG monitoring.        Final Clinical Impression(s) / ED Diagnoses Final diagnoses:  Chest pain, unspecified type  Acute bilateral thoracic back pain  Hyperglycemia    Rx / DC Orders ED Discharge Orders          Ordered    cyclobenzaprine (FLEXERIL) 10 MG tablet  3 times daily PRN        04/24/24 1953    omeprazole (PRILOSEC) 20 MG capsule  Daily        04/24/24 1953              Wynetta Heckle, MD 04/24/24 2002

## 2024-04-24 NOTE — ED Triage Notes (Signed)
 Pt via pov from home with chest pain; has had tenderness in ribcage for a couple of weeks, had pressure/heaviness in her chest today. Pt states she has had chronic back pain as well. Hx of panic disorder, and was prescribed steroid for her back pain this week, which may have made the anxiety worse. Pt denies sob, n/v. Pt alert & oriented, nad noted.
